# Patient Record
Sex: Male | Born: 1955 | Race: White | Hispanic: No | Marital: Married | Smoking: Former smoker
Health system: Southern US, Community
[De-identification: ages and names within clinical notes are randomized; demographics above are authoritative.]

## PROBLEM LIST (undated history)

## (undated) DIAGNOSIS — I10 Essential (primary) hypertension: Secondary | ICD-10-CM

## (undated) DIAGNOSIS — M1990 Unspecified osteoarthritis, unspecified site: Secondary | ICD-10-CM

## (undated) DIAGNOSIS — E119 Type 2 diabetes mellitus without complications: Secondary | ICD-10-CM

## (undated) DIAGNOSIS — M65312 Trigger thumb, left thumb: Secondary | ICD-10-CM

## (undated) DIAGNOSIS — K219 Gastro-esophageal reflux disease without esophagitis: Secondary | ICD-10-CM

## (undated) DIAGNOSIS — E785 Hyperlipidemia, unspecified: Secondary | ICD-10-CM

## (undated) DIAGNOSIS — N400 Enlarged prostate without lower urinary tract symptoms: Secondary | ICD-10-CM

## (undated) HISTORY — DX: Benign prostatic hyperplasia without lower urinary tract symptoms: N40.0

## (undated) HISTORY — PX: CARPAL TUNNEL RELEASE: SHX101

## (undated) HISTORY — DX: Gastro-esophageal reflux disease without esophagitis: K21.9

## (undated) HISTORY — DX: Essential (primary) hypertension: I10

## (undated) HISTORY — DX: Hyperlipidemia, unspecified: E78.5

---

## 1998-12-04 ENCOUNTER — Other Ambulatory Visit: Admission: RE | Admit: 1998-12-04 | Discharge: 1998-12-04 | Payer: Self-pay | Admitting: Gastroenterology

## 1999-01-22 ENCOUNTER — Encounter: Payer: Self-pay | Admitting: Gastroenterology

## 1999-01-22 ENCOUNTER — Ambulatory Visit (HOSPITAL_COMMUNITY): Admission: RE | Admit: 1999-01-22 | Discharge: 1999-01-22 | Payer: Self-pay | Admitting: Gastroenterology

## 1999-01-29 ENCOUNTER — Ambulatory Visit (HOSPITAL_COMMUNITY): Admission: RE | Admit: 1999-01-29 | Discharge: 1999-01-29 | Payer: Self-pay | Admitting: Gastroenterology

## 1999-01-29 ENCOUNTER — Encounter: Payer: Self-pay | Admitting: Gastroenterology

## 1999-03-12 ENCOUNTER — Encounter: Payer: Self-pay | Admitting: Gastroenterology

## 1999-03-12 ENCOUNTER — Ambulatory Visit (HOSPITAL_COMMUNITY): Admission: RE | Admit: 1999-03-12 | Discharge: 1999-03-12 | Payer: Self-pay | Admitting: Gastroenterology

## 2003-12-10 ENCOUNTER — Encounter: Admission: RE | Admit: 2003-12-10 | Discharge: 2004-01-28 | Payer: Self-pay | Admitting: Orthopedic Surgery

## 2008-02-23 ENCOUNTER — Inpatient Hospital Stay (HOSPITAL_COMMUNITY): Admission: EM | Admit: 2008-02-23 | Discharge: 2008-03-02 | Payer: Self-pay | Admitting: Emergency Medicine

## 2008-02-24 ENCOUNTER — Encounter (INDEPENDENT_AMBULATORY_CARE_PROVIDER_SITE_OTHER): Payer: Self-pay | Admitting: Internal Medicine

## 2008-03-07 ENCOUNTER — Ambulatory Visit: Payer: Self-pay | Admitting: Gastroenterology

## 2010-09-23 ENCOUNTER — Encounter
Admission: RE | Admit: 2010-09-23 | Discharge: 2010-11-06 | Payer: Self-pay | Source: Home / Self Care | Attending: Podiatry | Admitting: Podiatry

## 2010-11-11 ENCOUNTER — Encounter
Admission: RE | Admit: 2010-11-11 | Discharge: 2010-12-09 | Payer: Self-pay | Source: Home / Self Care | Attending: Podiatry | Admitting: Podiatry

## 2011-03-24 NOTE — Consult Note (Signed)
[name], [name]                ACCOUNT NO.:  [id number]   MEDICAL RECORD NO.:  [id number]          PATIENT TYPE:  INP   LOCATION:  5157                         FACILITY:  [facility]   PHYSICIAN:  Maree Krabbe, M.D.DATE OF BIRTH:  02-14-1956   DATE OF CONSULTATION:  DATE OF DISCHARGE:                                 CONSULTATION   REASON FOR CONSULTATION:  Increased creatinine.   HISTORY:  This is a pleasant 55 year old white male with a history of  hypertension, otherwise healthy.  He was admitted on February 23, 2008,  with a 4-day history of abdominal pain, vomiting, and diarrhea.  He had  been already seen and treated with oral Cipro/Flagyl for possible  diverticulitis.  He was unable to eat or drink.  He had cramping in the  lower abdominal pain on both sides.  He was on ACE inhibitors for blood  pressure and has a creatinine of 4.6 on admission with a BUN of 43.  The  patient was admitted and put on IV fluids, his creatinine was up to 6.6  on February 24, 2008, and today, it is 6.6 also.  His urine output has  improved.   The patient denies any use of over-the-counter NSAIDs.  He has not had  any exposure to IV contrast or Lasix.  Currently, he complains of being  thirsty and a Foley catheter was placed for about 12 hours, but he could  not tolerate it and it was removed last night.  He made 1200 mL of urine  yesterday.   PAST MEDICAL HISTORY:  Hypertension.   MEDICATIONS ON ADMISSION:  Cipro, lisinopril, hydrochlorothiazide 20/25  once a day, oral Flagyl, Allegra, Bentyl, Lipitor, Nexium, Zantac, and  Zestril which is duplication of an ACE inhibitor 20 mg once a day.   PAST SURGICAL HISTORY:  Carpal tunnel.   SOCIAL HISTORY:  Works in [employer], operational.  No smoking, alcohol, or drug  use.  He is married and lives with his wife.  They have 1 grown child.   FAMILY HISTORY:  Colon cancer in the mother and father with COPD.   REVIEW OF SYSTEMS:  Noncontributory.   PHYSICAL  EXAMINATION:  VITAL SIGNS:  Blood pressure 143/84, temperature  98.4, pulse is 100, at rest, respirations is 20, O2 sat at 93% on room  air.  GENERAL:  This is a healthy appearing man, in no distress.  SKIN:  Without rash.  HEENT:  PERRL, EOMI.  Throat is clear and slightly dry.  NECK:  Supple with flat neck veins.  CHEST:  Clear throughout the bases.  Clear to percussion also anterior  and posterior.  CARDIAC:  Regular rate and rhythm without murmur, rub, or gallop.  ABDOMEN:  Soft and nontender without masses.  Good bowel sounds.  EXTREMITIES:  No peripheral edema in the feet.  GU:  Normal male genitalia.  NEUROLOGIC:  Nonfocal motor exam, oriented x3.   LABORATORY DATA:  Urine culture, 2000 colonies and insignificant growth.  Blood cultures negative x2.  BUN 56, creatinine 6.6 today, bicarb 19,  potassium of 3.6.  Urine sodium  is 49, phosphorus is 5.2.  UA on 2  occasion on February 23, 2008 and February 24, 2008, is entirely negative with  less than 2 white blood cells and red blood cells and no protein.  The  white blood count is 10.2, platelets are normal at 256, hemoglobin 12.6.  Renal ultrasound done yesterday shows a right kidney of 11.6 cm, left  kidney of 12.4 cm, and no hydronephrosis.   IMPRESSION:  1. Acute renal failure due to acute tubular necrosis from dehydration      and ACE inhibitor effect.  2. Nausea, vomiting, and diarrhea with probable colitis or      diverticulitis, improved.  3. Volume depletion; still looks a bit dry.  4. Hypertension.  5. Rule out benign prostatic hypertrophy (positive symptoms of      incomplete voiding for a year or so).   RECOMMENDATIONS:  1. Increased IV fluids.  Check PSA, agree with current management      including holding ACE inhibitor until creatinine is normal.  2. Check bladder scan in the morning.   DISCUSSION:  This is a patient with acute renal failure in the setting  of acute dehydrating GI illness and ACE inhibitor  exposure.  The renal  failure is likely due to ATN.  SPEP and UPEP have been ordered.  We will  increase his fluids, and he is making good urine now, and I think his  creatinine has probably plateaued today and may start to improve  tomorrow.  Urinalysis is benign.      Maree Krabbe, M.D.  Electronically Signed     RDS/MEDQ  D:  02/26/2008  T:  02/26/2008  Job:  [id number]

## 2011-03-24 NOTE — H&P (Signed)
[name], ARNALL                ACCOUNT NO.:  [id number]   MEDICAL RECORD NO.:  [id number]          PATIENT TYPE:  INP   LOCATION:  1843                         FACILITY:  [facility]   PHYSICIAN:  Hind I Elsaid, MD      DATE OF BIRTH:  October 30, 1956   DATE OF ADMISSION:  02/23/2008  DATE OF DISCHARGE:                              HISTORY & PHYSICAL   CHIEF COMPLAINT:  Abdominal pain and diarrhea since Sunday.   HISTORY OF PRESENT ILLNESS:  This is a 55 year old Caucasian male with  the history of hypertension, hyperlipidemia who started to complain of  lower abdominal pain, started Sunday.  The pain is crampy in nature,  9/10, nonradiating.  Condition associated with diarrhea, which the  patient admitted, he go to bathroom every 15-20 minutes.  Condition also  associated with mild blood per rectum, he noticed on Sunday and Monday.  He went to his primary care physician, were advised to go to [facility]  ED.  The patient went there where he had a CT scan of the abdomen and  pelvis, which showed diverticulitis.  He was treated with Cipro and  Flagyl p.o.  The patient had a visit to [facility] on February 21, 2008.  After that the patient did not feel any significant improvement, he  continued to have nausea and dry heaves, but denies any vomiting.  The  patient admitted abdominal pain getting worse and the diarrhea continued  to persist.  The patient admitted blood on the diarrhea.  The blood on  the stool completely resolved at this time.  Condition associated with  generalized weakness.  Loss of appetite.  According to the patient, he  had his own gastroenterologist, Dr. Robb Matar, where he had a  colonoscopy 1-1/2 year ago with no significant findings.   ALLERGIES:  COZAAR.   MEDICATIONS:  1. Cipro 500 mg.  2. Dicyclomine 10 mg three times daily.  3. Lisinopril/hydrochlorothiazide 20/25 one daily.  4. Metronidazole 500 mg four times daily.  5. Allegra 1 tab daily.  6. Bentyl 1 tab  daily.  7. Lipitor 20 mg daily.  8. Nexium 40 mg daily.  9. Zestril 20 mg daily.   PAST SURGICAL HISTORY:  History of left carpal tunnel syndrome release,  no other surgeries.   SOCIAL HISTORY:  He works in [employer].  He denies any smoking, he  denies any alcohol or drug abuse.  He is married and lives with his  wife.  He has 1 grown child.   FAMILY HISTORY:  Positive for history of colon cancer in his mother and  father died with history of COPD.  He also had family history of lung  cancer in his aunt who died at the age 40.   PHYSICAL EXAMINATION:  VITAL SIGNS:  Temperature 98, blood pressure  127/86, pulse rate 89, and respiratory rate 18.  HEENT:  The patient not in any respiratory distress or in pain.  Pupils  equal and reactive to light and accommodation.  Mucosa dry.  No  tonsillar hypertrophy.  No lymphadenopathy.  No thyromegaly.  LUNGS:  Normal vesicular breathing with equal air entry.  ABDOMEN:  Soft.  Some tenderness at the left lower quadrant.  No  rebound.  No guarding.  Bowel sounds positive.  No organomegaly.  EXTREMITIES:  Peripheral pulses intact and no lower limb edema.  CNS:  The patient alert and oriented x3.  There is no focal or  neurological finding.   LABORATORY DATA:  Blood workup: Sodium 134, potassium 4.1, chloride 98,  carbon dioxide 20, glucose 129, BUN 43, creatinine 4.67, calcium 9.5,  total protein 7.2, albumin 4.2, AST 24, ALT 23, alkaline phosphatase 59,  and bilirubin 0.9.  White blood cells 12.9, hemoglobin 13.9, hematocrit  40.7, and platelets 248.  EKG, normal sinus rhythm.   ASSESSMENT AND PLAN:  1. This is a 55 year old male who failed outpatient treatment for      acute diverticulitis admitted with persistent diarrhea and      abdominal pain.  Diagnosis most probably secondary to      diverticulitis.  We need to rule out E. coli infection/iscemic      colitis.  Anyhow, the patient will be admitted to the hospital,      keep n.p.o.,  start on IV fluids, start Cipro and Flagyl IV, we will      get the CT scan of the abdomen and pelvis report or the CD from      [facility], otherwise we will go ahead and get CT of the      abdomen and pelvis during hospitalization.  Further recommendations      depend on patient's hospital course.  2. Regarding acute renal failure, the patient will be started on IV      fluids.  We will hold all his blood pressure medication.  3. History of hypertension.  Blood pressure reasonably controlled at      this time.  We will hold all blood pressure medications.  We will      place the patient on Clonidine p.o. as p.r.n., dose to be started      if systolic blood pressure above 914.  DVT and GI for prophylaxis.      Hind Bosie Helper, MD  Electronically Signed     HIE/MEDQ  D:  02/23/2008  T:  02/23/2008  Job:  [id number]

## 2011-03-24 NOTE — Discharge Summary (Signed)
[name], [name] NO.:  [id number]   MEDICAL RECORD NO.:  [id number]          PATIENT TYPE:  INP   LOCATION:  5157                         FACILITY:  [facility]   PHYSICIAN:  Altha Harm, MDDATE OF BIRTH:  10-26-1956   DATE OF ADMISSION:  02/23/2008  DATE OF DISCHARGE:                               DISCHARGE SUMMARY   ANTICIPATED DATE OF DISCHARGE:  March 02, 2008.   DISCHARGE DISPOSITION:  Home   FINAL DISCHARGE DIAGNOSES:  1. Intractable vomiting and diarrhea.  2. Dehydration, resolved.  3. Colitis, resolved.  4. Acute renal failure.  5. Acute tubular necrosis.  6. Hypertension.  7. Hyperlipidemia.   MEDICATIONS AT DISCHARGE:  1. Lipitor 20 mg p.o. daily.  2. Aspirin 81 mg p.o. daily.  3. Multivitamin 1 tablet p.o. daily.  4. Lopressor 50 mg p.o. b.i.d.  5. Norvasc 10 mg p.o. daily  6. Multivitamin 1 tablet p.o. daily.   CONSULTANTS:  1. Nephrology.  2. Gastroenterology.   PROCEDURES:  None.   DIAGNOSTIC STUDIES:  1. Renal ultrasound done on admission which shows no hydronephrosis or      diagnostic renal calculus.  Trace perinephric fluid noted lower      pole of the left kidney.  Probable fatty infiltration of the liver.  2. CT of the abdomen without contrast which shows mild circumferential      wall thickening of the distal descending colon with some very mild      pericolic fat changes.  These changes are more suggestive of acute      colitis.  Potential etiologies include infectious, inflammatory, or      ischemic.  3. CT of the pelvis without contrast which shows findings as per CT of      the abdomen.  4. Abdominal one view done on April 21 which shows a benign-appearing      abdomen.  5. Ultrasound of the abdomen done on April 21 which shows no evidence      of ascites, bilateral pleural effusions, fatty infiltration of      liver.   CODE STATUS:  Full code.   ALLERGIES:  LOSARTAN.   Medications to be avoided:  ACE  inhibitors secondary to acute renal  failure.   PRIMARY CARE PHYSICIAN:  Dr. Rudi Heap, [facility]/Donna Bevelyn Buckles, nurse practitioner.   Abdominal pain and diarrhea x4 days.   HISTORY OF PRESENT ILLNESS:  Please see H&P dictated by Dr. Eda Paschal for  details of the HPI.   HOSPITAL COURSE:  1. Colitis.  The patient was admitted and placed on bowel rest.  He      was given aggressive fluid hydration and started on ciprofloxacin      and Flagyl.  Gastroenterology was consulted and saw the patient,      and the patient was subsequently taken off of the Cipro and Flagyl      as it was felt that this was not an infectious etiology disease.      The diarrhea and vomiting resolved itself, and the patient was able  to tolerate diet without any difficulty.  2. Acute tubular necrosis with acute renal failure.  This was felt      that it was secondary to the patient's hypovolemic state coupled      with the use of an ACE inhibitor.  The patient had significant      increase in his creatinine which peaked at 6.6 on the day of      admission and so forth subsequently resolved down to a creatinine      on April 23 of 3.89.  The patient was seen by the nephrologist who      managed the patient conservatively without any need for dialysis.      The patient is now off of IV fluids and off of diuretics at this      point.  The patient will be seen by the nephrologist in the      morning, and an addendum to this discharge summary may be necessary      depending on the plan set up by the nephrologist.  3. Hypertension.  The patient had been on lisinopril for his      hypertension.  The patient has had elevated blood pressures here in      the hospital.  It is felt that this is contributory by his renal      failure.  I expect that the blood pressures will improve as his      renal function improves.  The patient is now on Norvasc and      Lopressor and the doses stated  above.  The patient should have      close follow-up for his blood pressure and further titration of      medications.  At this point, I would stay away from any diuretics      unless directed by the nephrologist.  If the patient still requires      an additional medication, I would suggest clonidine in this      patient.  However, the patient still has much room for increase in      his Lopressor and his Norvasc as it necessitates.  I will defer      this to his treating physician in the outpatient setting to further      titrate his medications.  In terms of his other chronic medical      problems, they remain stable, and the patient is continued on his      medications above.   FOLLOW UP:  The patient is to follow up with the nephrologist in the  time frame that will be specified at discharge.  Follow up with his  primary care physician in 1 week for monitoring of blood pressure.   DIETARY RESTRICTIONS:  The patient should be on 2-g sodium low  cholesterol diet.   PHYSICAL RESTRICTIONS:  None.      Altha Harm, MD  Electronically Signed     MAM/MEDQ  D:  03/01/2008  T:  03/01/2008  Job:  [id number]   cc:   Deter[place], M.D.  Ernestina Penna, M.D.

## 2011-08-04 LAB — CBC
HCT: 31.9 — ABNORMAL LOW
HCT: 34.2 — ABNORMAL LOW
HCT: 36.1 — ABNORMAL LOW
Hemoglobin: 10.9 — ABNORMAL LOW
Hemoglobin: 10.9 — ABNORMAL LOW
Hemoglobin: 11.1 — ABNORMAL LOW
Hemoglobin: 11.9 — ABNORMAL LOW
Hemoglobin: 11.9 — ABNORMAL LOW
Hemoglobin: 12.6 — ABNORMAL LOW
MCHC: 34.1
MCHC: 34.8
MCHC: 34.9
MCHC: 35.3
MCV: 83.9
MCV: 84.1
MCV: 84.7
Platelets: 248
Platelets: 323
Platelets: 347
RBC: 3.77 — ABNORMAL LOW
RBC: 3.95 — ABNORMAL LOW
RBC: 4.07 — ABNORMAL LOW
RDW: 13.6
RDW: 13.6
RDW: 13.7
RDW: 13.9
WBC: 10.7 — ABNORMAL HIGH
WBC: 8
WBC: 9.2
WBC: 9.4

## 2011-08-04 LAB — BASIC METABOLIC PANEL
BUN: 37 — ABNORMAL HIGH
BUN: 54 — ABNORMAL HIGH
CO2: 19
CO2: 19
CO2: 20
Calcium: 8.3 — ABNORMAL LOW
Calcium: 8.7
Calcium: 8.8
Chloride: 106
Chloride: 106
Creatinine, Ser: 4.25 — ABNORMAL HIGH
Creatinine, Ser: 5.63 — ABNORMAL HIGH
GFR calc Af Amer: 11 — ABNORMAL LOW
GFR calc Af Amer: 13 — ABNORMAL LOW
GFR calc non Af Amer: 11 — ABNORMAL LOW
GFR calc non Af Amer: 15 — ABNORMAL LOW
GFR calc non Af Amer: 9 — ABNORMAL LOW
Glucose, Bld: 100 — ABNORMAL HIGH
Glucose, Bld: 103 — ABNORMAL HIGH
Glucose, Bld: 89
Potassium: 3.6
Potassium: 3.8
Sodium: 138
Sodium: 139
Sodium: 144

## 2011-08-04 LAB — COMPREHENSIVE METABOLIC PANEL
ALT: 23
Albumin: 3.3 — ABNORMAL LOW
Albumin: 4.2
Alkaline Phosphatase: 36 — ABNORMAL LOW
Alkaline Phosphatase: 39
BUN: 51 — ABNORMAL HIGH
CO2: 21
Chloride: 107
Creatinine, Ser: 6.03 — ABNORMAL HIGH
GFR calc non Af Amer: 10 — ABNORMAL LOW
Potassium: 3.8
Potassium: 4.1
Sodium: 134 — ABNORMAL LOW
Total Bilirubin: 0.5
Total Protein: 7.2

## 2011-08-04 LAB — URINE MICROSCOPIC-ADD ON

## 2011-08-04 LAB — RENAL FUNCTION PANEL
Albumin: 2.7 — ABNORMAL LOW
BUN: 37 — ABNORMAL HIGH
BUN: 42 — ABNORMAL HIGH
CO2: 19
CO2: 25
CO2: 26
Calcium: 8.3 — ABNORMAL LOW
Calcium: 8.9
Chloride: 108
Chloride: 110
Chloride: 113 — ABNORMAL HIGH
Chloride: 113 — ABNORMAL HIGH
Creatinine, Ser: 3.26 — ABNORMAL HIGH
Creatinine, Ser: 3.89 — ABNORMAL HIGH
GFR calc Af Amer: 13 — ABNORMAL LOW
GFR calc Af Amer: 24 — ABNORMAL LOW
GFR calc non Af Amer: 11 — ABNORMAL LOW
GFR calc non Af Amer: 20 — ABNORMAL LOW
Glucose, Bld: 112 — ABNORMAL HIGH
Glucose, Bld: 127 — ABNORMAL HIGH
Potassium: 3.2 — ABNORMAL LOW
Potassium: 3.5
Sodium: 140
Sodium: 143

## 2011-08-04 LAB — FECAL LACTOFERRIN, QUANT

## 2011-08-04 LAB — UIFE/LIGHT CHAINS/TP QN, 24-HR UR
Alpha 2, Urine: DETECTED — AB
Beta, Urine: DETECTED — AB
Free Kappa Lt Chains,Ur: 1.15 (ref 0.04–1.51)
Free Lambda Lt Chains,Ur: 0.24 (ref 0.08–1.01)
Gamma Globulin, Urine: DETECTED — AB

## 2011-08-04 LAB — URINALYSIS, ROUTINE W REFLEX MICROSCOPIC
Glucose, UA: NEGATIVE
Hgb urine dipstick: NEGATIVE
Protein, ur: 100 — AB
pH: 5

## 2011-08-04 LAB — URINALYSIS, MICROSCOPIC ONLY
Bilirubin Urine: NEGATIVE
Nitrite: NEGATIVE
Specific Gravity, Urine: 1.01
Urobilinogen, UA: 0.2
pH: 5

## 2011-08-04 LAB — URINE CULTURE

## 2011-08-04 LAB — PROTIME-INR: INR: 1

## 2011-08-04 LAB — DIFFERENTIAL
Basophils Relative: 0
Eosinophils Absolute: 0.1
Lymphs Abs: 1.2
Monocytes Absolute: 1.2 — ABNORMAL HIGH
Monocytes Relative: 10
Neutro Abs: 10.3 — ABNORMAL HIGH

## 2011-08-04 LAB — URINE DRUGS OF ABUSE SCREEN W ALC, ROUTINE (REF LAB)
Amphetamine Screen, Ur: NEGATIVE
Barbiturate Quant, Ur: NEGATIVE
Creatinine,U: 187.8
Ethyl Alcohol: <5
Marijuana Metabolite: NEGATIVE
Methadone: NEGATIVE

## 2011-08-04 LAB — CULTURE, BLOOD (ROUTINE X 2)
Culture: NO GROWTH
Culture: NO GROWTH

## 2011-08-04 LAB — HEMOGLOBIN A1C: Mean Plasma Glucose: 133

## 2011-08-04 LAB — CLOSTRIDIUM DIFFICILE EIA: C difficile Toxins A+B, EIA: NEGATIVE

## 2011-08-04 LAB — APTT: aPTT: 29

## 2011-08-04 LAB — PROTEIN ELECTROPHORESIS, SERUM
Alpha-1-Globulin: 6.4 — ABNORMAL HIGH
Alpha-2-Globulin: 14.2 — ABNORMAL HIGH
Beta Globulin: 6.8
Total Protein ELP: 6.2

## 2011-08-04 LAB — GIARDIA/CRYPTOSPORIDIUM SCREEN(EIA): Giardia Screen - EIA: NEGATIVE

## 2011-08-04 LAB — MAGNESIUM
Magnesium: 1.6
Magnesium: 1.9

## 2011-08-04 LAB — OSMOLALITY, URINE: Osmolality, Ur: 265 — ABNORMAL LOW

## 2011-08-04 LAB — HEPATITIS PANEL, ACUTE: Hepatitis B Surface Ag: NEGATIVE

## 2011-08-04 LAB — SODIUM, URINE, RANDOM: Sodium, Ur: 49

## 2012-02-04 ENCOUNTER — Encounter (INDEPENDENT_AMBULATORY_CARE_PROVIDER_SITE_OTHER): Payer: Self-pay | Admitting: *Deleted

## 2012-02-22 ENCOUNTER — Encounter (INDEPENDENT_AMBULATORY_CARE_PROVIDER_SITE_OTHER): Payer: Self-pay | Admitting: Internal Medicine

## 2012-02-22 ENCOUNTER — Other Ambulatory Visit (INDEPENDENT_AMBULATORY_CARE_PROVIDER_SITE_OTHER): Payer: Self-pay | Admitting: *Deleted

## 2012-02-22 ENCOUNTER — Ambulatory Visit (INDEPENDENT_AMBULATORY_CARE_PROVIDER_SITE_OTHER): Payer: BC Managed Care – PPO | Admitting: Internal Medicine

## 2012-02-22 ENCOUNTER — Telehealth (INDEPENDENT_AMBULATORY_CARE_PROVIDER_SITE_OTHER): Payer: Self-pay | Admitting: *Deleted

## 2012-02-22 VITALS — BP 98/70 | HR 64 | Temp 98.1°F | Ht 68.0 in | Wt 184.8 lb

## 2012-02-22 DIAGNOSIS — Z800 Family history of malignant neoplasm of digestive organs: Secondary | ICD-10-CM

## 2012-02-22 DIAGNOSIS — E7800 Pure hypercholesterolemia, unspecified: Secondary | ICD-10-CM | POA: Insufficient documentation

## 2012-02-22 DIAGNOSIS — I10 Essential (primary) hypertension: Secondary | ICD-10-CM | POA: Insufficient documentation

## 2012-02-22 MED ORDER — PEG-KCL-NACL-NASULF-NA ASC-C 100 G PO SOLR: 1.0000 | Freq: Once | ORAL | Status: DC

## 2012-02-22 NOTE — Patient Instructions (Signed)
Bloating  Bloating is the feeling of fullness in your belly. You may feel as though your pants are too tight. Often the cause of bloating is overeating, retaining fluids, or having gas in your bowel. It is also caused by swallowing air and eating foods that cause gas. Irritable bowel syndrome is one of the most common causes of bloating. Constipation is also a common cause. Sometimes more serious problems can cause bloating.  SYMPTOMS    Usually there is a feeling of fullness, as though your abdomen is bulged out. There may be mild discomfort.    DIAGNOSIS    Usually no particular testing is necessary for most bloating. If the condition persists and seems to become worse, your caregiver may do additional testing.    TREATMENT     There is no direct treatment for bloating.    Do not put gas into the bowel. Avoid chewing gum and sucking on candy. These tend to make you swallow air. Swallowing air can also be a nervous habit. Try to avoid this.    Avoiding high residue diets will help. Eat foods with soluble fibers (examples include root vegetables, apples, or barley) and substitute dairy products with soy and rice products. This helps irritable bowel syndrome.    If constipation is the cause, then a high residue diet with more fiber will help.    Avoid carbonated beverages.    Over-the-counter preparations are available that help reduce gas. Your pharmacist can help you with this.   SEEK MEDICAL CARE IF:     Bloating continues and seems to be getting worse.    You notice a weight gain.    You have a weight loss but the bloating is getting worse.    You have changes in your bowel habits or develop nausea or vomiting.   SEEK IMMEDIATE MEDICAL CARE IF:     You develop shortness of breath or swelling in your legs.    You have an increase in abdominal pain or develop chest pain.   Document Released: 08/26/2006 Document Revised: 10/15/2011 Document Reviewed: 10/14/2007   [employer] Patient Information 2012 [employer].

## 2012-02-22 NOTE — Progress Notes (Signed)
Subjective:     Patient ID: [name], male   DOB: 08/23/1956, 56 y.o.   MRN: [id number]  HPI  Referred to our office by Joette Catching for bloating. No acid reflux.  He says his stomach get very tight and hurts his back.  He tells me he had colitis  2 yrs ago.  He says he has lost 6 pounds  4-5 weeks ago when he had a virus.  He is now on a diet for weight loss.   His symptoms included diarrhea, vomiting, no appetite, nausea and fever.  He was put on Cipro and Flagyl and symptoms resolved except for the diarrhea. The diarrhea continued for about 2 weeks.    He is having ablout one stool a day. Stool are normal size.  No melena or bright red rectal bleeding.  No abdominal pain except for the bloating.  His last colonoscopy was 5-6 yrs ago by Dr. Cleotis Nipper which he tells me was normal. Family hx of colon cancer in a 1st degree relative (Mother) CT abdomen and pelvis 02/16/2008:PROCEDURES: None.  DIAGNOSTIC STUDIES:  1. Renal ultrasound done on admission which shows no hydronephrosis or  diagnostic renal calculus. Trace perinephric fluid noted lower  pole of the left kidney. Probable fatty infiltration of the liver.  2. CT of the abdomen without contrast which shows mild circumferential  wall thickening of the distal descending colon with some very mild  pericolic fat changes. These changes are more suggestive of acute  colitis. Potential etiologies include infectious, inflammatory, or  ischemic.  3. CT of the pelvis without contrast which shows findings as per CT of  the abdomen.  4. Abdominal one view done on April 21 which shows a benign-appearing  abdomen.  5. Ultrasound of the abdomen done on April 21 which shows no evidence  of ascites, bilateral pleural effusions, fatty infiltration of  Review of Systems see hpi Current Outpatient Prescriptions  Medication Sig Dispense Refill  . amLODipine (NORVASC) 10 MG tablet Take 10 mg by mouth daily.      Marland Kitchen aspirin 81 MG tablet Take 81 mg by  mouth daily.      Marland Kitchen lisinopril (PRINIVIL,ZESTRIL) 10 MG tablet Take 10 mg by mouth daily.      . rosuvastatin (CRESTOR) 10 MG tablet Take 10 mg by mouth daily.       Past Medical History  Diagnosis Date  . Hypertension   . GERD (gastroesophageal reflux disease)   . Hyperlipidemia   . Enlarged prostate    History   Social History  . Marital Status: Married    Spouse Name: N/A    Number of Children: N/A  . Years of Education: N/A   Occupational History  . Not on file.   Social History Main Topics  . Smoking status: Never Smoker   . Smokeless tobacco: Not on file  . Alcohol Use: No  . Drug Use: No  . Sexually Active: Not on file   Other Topics Concern  . Not on file   Social History Narrative  . No narrative on file   Family Status  Relation Status Death Age  . Mother Deceased     Vascular   . Father Deceased     COPD  . Sister Deceased     One deceased from ?, One with atrial flutter, diabetes. One is good health but obese.   Allergies  Allergen Reactions  . Cozaar        Objective:  Physical Exam  Filed Vitals:   02/22/12 1531  Height: 5\' 8"  (1.727 m)  Weight: 184 lb 12.8 oz (83.825 kg)    Alert and oriented. Skin warm and dry. Oral mucosa is moist.   . Sclera anicteric, conjunctivae is pink. Thyroid not enlarged. No cervical lymphadenopathy. Lungs clear. Heart regular rate and rhythm.  Abdomen is soft. Bowel sounds are positive. No hepatomegaly. No abdominal masses felt. No tenderness.  No edema to lower extremities. Patient is alert and oriented.      Assessment:    Bloating. Family hx of colon cancer.    Plan:    Surveillance colonoscopy for family hx of colon cancer. Samples of Aciphex given to patient to try.

## 2012-02-22 NOTE — Telephone Encounter (Signed)
Patient needs movi prep 

## 2012-03-23 MED ORDER — SODIUM CHLORIDE 0.45 % IV SOLN
Freq: Once | INTRAVENOUS | Status: AC
  Administered 2012-03-24: 1000 mL via INTRAVENOUS

## 2012-03-24 ENCOUNTER — Encounter (HOSPITAL_COMMUNITY): Payer: Self-pay | Admitting: *Deleted

## 2012-03-24 ENCOUNTER — Encounter (HOSPITAL_COMMUNITY)
Admission: RE | Disposition: A | Discharge status: Home / Self Care | Payer: Self-pay | Source: Ambulatory Visit | Attending: Internal Medicine

## 2012-03-24 ENCOUNTER — Ambulatory Visit (HOSPITAL_COMMUNITY)
Admission: RE | Admit: 2012-03-24 | Discharge: 2012-03-24 | Disposition: A | Discharge status: Home / Self Care | Payer: BC Managed Care – PPO | Source: Ambulatory Visit | Attending: Internal Medicine | Admitting: Internal Medicine

## 2012-03-24 DIAGNOSIS — K5730 Diverticulosis of large intestine without perforation or abscess without bleeding: Secondary | ICD-10-CM | POA: Insufficient documentation

## 2012-03-24 DIAGNOSIS — Z86010 Personal history of colonic polyps: Secondary | ICD-10-CM

## 2012-03-24 DIAGNOSIS — Z800 Family history of malignant neoplasm of digestive organs: Secondary | ICD-10-CM

## 2012-03-24 DIAGNOSIS — Z09 Encounter for follow-up examination after completed treatment for conditions other than malignant neoplasm: Secondary | ICD-10-CM | POA: Insufficient documentation

## 2012-03-24 DIAGNOSIS — I10 Essential (primary) hypertension: Secondary | ICD-10-CM | POA: Insufficient documentation

## 2012-03-24 DIAGNOSIS — Z7982 Long term (current) use of aspirin: Secondary | ICD-10-CM | POA: Insufficient documentation

## 2012-03-24 DIAGNOSIS — D126 Benign neoplasm of colon, unspecified: Secondary | ICD-10-CM

## 2012-03-24 DIAGNOSIS — K210 Gastro-esophageal reflux disease with esophagitis: Secondary | ICD-10-CM

## 2012-03-24 DIAGNOSIS — K644 Residual hemorrhoidal skin tags: Secondary | ICD-10-CM

## 2012-03-24 DIAGNOSIS — Z79899 Other long term (current) drug therapy: Secondary | ICD-10-CM | POA: Insufficient documentation

## 2012-03-24 HISTORY — PX: COLONOSCOPY: SHX5424

## 2012-03-24 SURGERY — COLONOSCOPY
Anesthesia: Moderate Sedation

## 2012-03-24 MED ORDER — STERILE WATER FOR IRRIGATION IR SOLN
Status: DC | PRN
  Administered 2012-03-24: 09:00:00

## 2012-03-24 MED ORDER — MEPERIDINE HCL 50 MG/ML IJ SOLN
INTRAMUSCULAR | Status: AC
  Filled 2012-03-24: qty 1

## 2012-03-24 MED ORDER — MIDAZOLAM HCL 5 MG/5ML IJ SOLN
INTRAMUSCULAR | Status: AC
  Filled 2012-03-24: qty 10

## 2012-03-24 MED ORDER — MEPERIDINE HCL 50 MG/ML IJ SOLN
INTRAMUSCULAR | Status: DC | PRN
  Administered 2012-03-24 (×2): 25 mg via INTRAVENOUS

## 2012-03-24 MED ORDER — MIDAZOLAM HCL 5 MG/5ML IJ SOLN
INTRAMUSCULAR | Status: DC | PRN
  Administered 2012-03-24 (×3): 2 mg via INTRAVENOUS

## 2012-03-24 NOTE — Op Note (Signed)
COLONOSCOPY PROCEDURE REPORT  PATIENT:  [name]  MR#:  [id number] Birthdate:  Jun 11, 1956, 56 y.o., male Endoscopist:  Dr. Malissa Hippo, MD Referred By:  Dr. Josue Hector, MD Procedure Date: 03/24/2012  Procedure:   Colonoscopy  Indications:  Patient is 56 year old Caucasian male with history of colonic polyps and family history of colon carcinoma (mother and maternal aunt). Patient is undergoing surveillance colonoscopy.  Informed Consent:  The procedure and risks were reviewed with the patient and informed consent was obtained.  Medications:  Demerol 50 mg IV Versed 6 mg IV  Description of procedure:  After a digital rectal exam was performed, that colonoscope was advanced from the anus through the rectum and colon to the area of the cecum, ileocecal valve and appendiceal orifice. The cecum was deeply intubated. These structures were well-seen and photographed for the record. From the level of the cecum and ileocecal valve, the scope was slowly and cautiously withdrawn. The mucosal surfaces were carefully surveyed utilizing scope tip to flexion to facilitate fold flattening as needed. The scope was pulled down into the rectum where a thorough exam including retroflexion was performed.  Findings:   Prep excellent. Three small diverticula noted to at sigmoid and one in transverse colon. Three tiny polyps ablated via cold biopsy from distal sigmoid colon and submitted together. Normal rectal mucosa. Dominant hemorrhoids below the dentate line.  Therapeutic/Diagnostic Maneuvers Performed:  See above  Complications:  None  Cecal Withdrawal Time:  10 minutes  Impression:  Examination performed to cecum. Three small diverticula as above. Three tiny polyps ablated via cold biopsy from sigmoid colon and submitted together. External hemorrhoids.  Recommendations:  Standard instructions given. Will try Align for bloating. I will contact patient with biopsy  results. Next colonoscopy in 5 years.  Tania Steinhauser U  03/24/2012 9:11 AM  CC: Dr. Josue Hector, MD, MD & Dr. Bonnetta Barry ref. provider found

## 2012-03-24 NOTE — H&P (Signed)
[name] is an 56 y.o. male.   Chief Complaint: Patient is here for colonoscopy. HPI: Patient is 57 year old Caucasian male who is in for surveillance colonoscopy. He has history of colonic polyps. First exam was 10 years ago with removal of 2 small polyps. Last exam 5 years ago in [facility] was normal. He denies abdominal pain melena rectal bleeding or change in bowel habits. He is experiencing intermittent bloating. Bloating has decreased since he has lost 35 pounds voluntarily. He rarely experiences heartburn. This again has improved with his weight loss. Family history significant for colon carcinoma in his mother at age 86 and she died at 72 of unrelated causes. Maternal aunts died of metastatic colon carcinoma at age 107 of the maternal aunt died of lung carcinoma.  Past Medical History  Diagnosis Date  . Hypertension   . GERD (gastroesophageal reflux disease)   . Hyperlipidemia   . Enlarged prostate     Past Surgical History  Procedure Date  . Carpal tunnel release     History reviewed. No pertinent family history. Social History:  reports that he has never smoked. He does not have any smokeless tobacco history on file. He reports that he does not drink alcohol or use illicit drugs.  Allergies:  Allergies  Allergen Reactions  . Cozaar     Medications Prior to Admission  Medication Sig Dispense Refill  . amLODipine (NORVASC) 10 MG tablet Take 10 mg by mouth daily.      Marland Kitchen aspirin 81 MG tablet Take 81 mg by mouth daily.      Marland Kitchen lisinopril (PRINIVIL,ZESTRIL) 10 MG tablet Take 10 mg by mouth daily.      . peg 3350 powder (MOVIPREP) SOLR Take 1 kit (100 g total) by mouth once.  1 kit  0  . rosuvastatin (CRESTOR) 10 MG tablet Take 10 mg by mouth daily.        No results found for this or any previous visit (from the past 48 hour(s)). No results found.  Review of Systems  Gastrointestinal: Negative for abdominal pain, diarrhea, constipation, blood in stool and  melena.    Blood pressure 144/88, pulse 76, temperature 98.2 F (36.8 C), temperature source Oral, resp. rate 16, height 5\' 8"  (1.727 m), weight 170 lb (77.111 kg), SpO2 97.00%. Physical Exam  Constitutional: He appears well-developed and well-nourished.  Eyes: Conjunctivae are normal. No scleral icterus.  Neck: No thyromegaly present.  Cardiovascular: Normal rate, regular rhythm and normal heart sounds.   No murmur heard. Respiratory: Effort normal and breath sounds normal.  GI: Soft. He exhibits no distension and no mass. There is no tenderness.  Musculoskeletal: He exhibits no edema.  Lymphadenopathy:    He has no cervical adenopathy.  Neurological: He is alert.  Skin: Skin is warm and dry.     Assessment/Plan History of colonic polyps. Family history of colon carcinoma. Surveillance colonoscopy.  Deavin Forst U 03/24/2012, 8:35 AM

## 2012-03-24 NOTE — Discharge Instructions (Addendum)
Resume usual medications and diet. Align 1 capsule by mouth daily(OTC) No driving for 24 hours. Physician will contact you with biopsy results.Colon Polyps A polyp is extra tissue that grows inside your body. Colon polyps grow in the large intestine. The large intestine, also called the colon, is part of your digestive system. It is a long, hollow tube at the end of your digestive tract where your body makes and stores stool. Most polyps are not dangerous. They are benign. This means they are not cancerous. But over time, some types of polyps can turn into cancer. Polyps that are smaller than a pea are usually not harmful. But larger polyps could someday become or may already be cancerous. To be safe, doctors remove all polyps and test them.  WHO GETS POLYPS? Anyone can get polyps, but certain people are more likely than others. You may have a greater chance of getting polyps if:  You are over 50.   You have had polyps before.   Someone in your family has had polyps.   Someone in your family has had cancer of the large intestine.   Find out if someone in your family has had polyps. You may also be more likely to get polyps if you:   Eat a lot of fatty foods.   Smoke.   Drink alcohol.   Do not exercise.   Eat too much.  SYMPTOMS  Most small polyps do not cause symptoms. People often do not know they have one until their caregiver finds it during a regular checkup or while testing them for something else. Some people do have symptoms like these:  Bleeding from the anus. You might notice blood on your underwear or on toilet paper after you have had a bowel movement.   Constipation or diarrhea that lasts more than a week.   Blood in the stool. Blood can make stool look black or it can show up as red streaks in the stool.  If you have any of these symptoms, see your caregiver. HOW DOES THE DOCTOR TEST FOR POLYPS? The doctor can use four tests to check for polyps:  Digital rectal  exam. The caregiver wears gloves and checks your rectum (the last part of the large intestine) to see if it feels normal. This test would find polyps only in the rectum. Your caregiver may need to do one of the other tests listed below to find polyps higher up in the intestine.   Barium enema. The caregiver puts a liquid called barium into your rectum before taking x-rays of your large intestine. Barium makes your intestine look white in the pictures. Polyps are dark, so they are easy to see.   Sigmoidoscopy. With this test, the caregiver can see inside your large intestine. A thin flexible tube is placed into your rectum. The device is called a sigmoidoscope, which has a light and a tiny video camera in it. The caregiver uses the sigmoidoscope to look at the last third of your large intestine.   Colonoscopy. This test is like sigmoidoscopy, but the caregiver looks at all of the large intestine. It usually requires sedation. This is the most common method for finding and removing polyps.  TREATMENT   The caregiver will remove the polyp during sigmoidoscopy or colonoscopy. The polyp is then tested for cancer.   If you have had polyps, your caregiver may want you to get tested regularly in the future.  PREVENTION  There is not one sure way to prevent polyps.  You might be able to lower your risk of getting them if you:  Eat more fruits and vegetables and less fatty food.   Do not smoke.   Avoid alcohol.   Exercise every day.   Lose weight if you are overweight.   Eating more calcium and folate can also lower your risk of getting polyps. Some foods that are rich in calcium are milk, cheese, and broccoli. Some foods that are rich in folate are chickpeas, kidney beans, and spinach.   Aspirin might help prevent polyps. Studies are under way.  Document Released: 07/22/2004 Document Revised: 10/15/2011 Document Reviewed: 12/28/2007 [facility] Patient Information 2012 [place], [place].

## 2012-03-28 ENCOUNTER — Encounter (HOSPITAL_COMMUNITY): Payer: Self-pay | Admitting: Internal Medicine

## 2012-03-29 ENCOUNTER — Encounter (INDEPENDENT_AMBULATORY_CARE_PROVIDER_SITE_OTHER): Payer: Self-pay | Admitting: *Deleted

## 2012-06-14 ENCOUNTER — Telehealth (INDEPENDENT_AMBULATORY_CARE_PROVIDER_SITE_OTHER): Payer: Self-pay | Admitting: *Deleted

## 2012-06-14 NOTE — Telephone Encounter (Signed)
[name], please arrange consultation with his surgeon. You may have to call and find his surgeon's name.

## 2012-06-14 NOTE — Telephone Encounter (Signed)
Patient's wife said [name] would like to get his hemorrhoids removed. They are bothering him really bad and the suppositories are not helping. Patient's return phone number is [phone].

## 2012-06-15 NOTE — Telephone Encounter (Signed)
I called and spoke with [name]. He does not know nor have a Careers adviser. He would like for his surgery to be done in [place]. Patient advised that [name] referral coordinator would call him back with information.  Ann please make an appointment for a surgical consult for patient.  Hemorrhoids are extremely bad per patient , to the point of him not being able to move.

## 2012-06-16 NOTE — Telephone Encounter (Signed)
appt sch'd 06/21/12 at 11:00 (10:45) w/ Dr Caesar Bookman, patient aware, records faxed

## 2013-08-08 ENCOUNTER — Encounter: Payer: BC Managed Care – PPO | Admitting: Family Medicine

## 2013-11-05 ENCOUNTER — Emergency Department (HOSPITAL_COMMUNITY)
Admission: EM | Admit: 2013-11-05 | Discharge: 2013-11-05 | Disposition: A | Discharge status: Home / Self Care | Payer: BC Managed Care – PPO | Attending: Emergency Medicine | Admitting: Emergency Medicine

## 2013-11-05 ENCOUNTER — Emergency Department (HOSPITAL_COMMUNITY): Payer: BC Managed Care – PPO

## 2013-11-05 ENCOUNTER — Encounter (HOSPITAL_COMMUNITY): Payer: Self-pay | Admitting: Emergency Medicine

## 2013-11-05 DIAGNOSIS — J321 Chronic frontal sinusitis: Secondary | ICD-10-CM | POA: Insufficient documentation

## 2013-11-05 DIAGNOSIS — J029 Acute pharyngitis, unspecified: Secondary | ICD-10-CM | POA: Insufficient documentation

## 2013-11-05 DIAGNOSIS — Z7982 Long term (current) use of aspirin: Secondary | ICD-10-CM | POA: Insufficient documentation

## 2013-11-05 DIAGNOSIS — R0982 Postnasal drip: Secondary | ICD-10-CM | POA: Insufficient documentation

## 2013-11-05 DIAGNOSIS — R509 Fever, unspecified: Secondary | ICD-10-CM | POA: Insufficient documentation

## 2013-11-05 DIAGNOSIS — R059 Cough, unspecified: Secondary | ICD-10-CM | POA: Insufficient documentation

## 2013-11-05 DIAGNOSIS — I10 Essential (primary) hypertension: Secondary | ICD-10-CM | POA: Insufficient documentation

## 2013-11-05 DIAGNOSIS — Z8719 Personal history of other diseases of the digestive system: Secondary | ICD-10-CM | POA: Insufficient documentation

## 2013-11-05 DIAGNOSIS — Y929 Unspecified place or not applicable: Secondary | ICD-10-CM | POA: Insufficient documentation

## 2013-11-05 DIAGNOSIS — S4980XA Other specified injuries of shoulder and upper arm, unspecified arm, initial encounter: Secondary | ICD-10-CM | POA: Insufficient documentation

## 2013-11-05 DIAGNOSIS — R52 Pain, unspecified: Secondary | ICD-10-CM | POA: Insufficient documentation

## 2013-11-05 DIAGNOSIS — Z87448 Personal history of other diseases of urinary system: Secondary | ICD-10-CM | POA: Insufficient documentation

## 2013-11-05 DIAGNOSIS — X58XXXA Exposure to other specified factors, initial encounter: Secondary | ICD-10-CM | POA: Insufficient documentation

## 2013-11-05 DIAGNOSIS — S139XXA Sprain of joints and ligaments of unspecified parts of neck, initial encounter: Secondary | ICD-10-CM | POA: Insufficient documentation

## 2013-11-05 DIAGNOSIS — S46909A Unspecified injury of unspecified muscle, fascia and tendon at shoulder and upper arm level, unspecified arm, initial encounter: Secondary | ICD-10-CM | POA: Insufficient documentation

## 2013-11-05 DIAGNOSIS — R05 Cough: Secondary | ICD-10-CM | POA: Insufficient documentation

## 2013-11-05 DIAGNOSIS — Z79899 Other long term (current) drug therapy: Secondary | ICD-10-CM | POA: Insufficient documentation

## 2013-11-05 DIAGNOSIS — S161XXA Strain of muscle, fascia and tendon at neck level, initial encounter: Secondary | ICD-10-CM

## 2013-11-05 DIAGNOSIS — Y939 Activity, unspecified: Secondary | ICD-10-CM | POA: Insufficient documentation

## 2013-11-05 DIAGNOSIS — E785 Hyperlipidemia, unspecified: Secondary | ICD-10-CM | POA: Insufficient documentation

## 2013-11-05 MED ORDER — AMOXICILLIN 500 MG PO CAPS: 500.0000 mg | ORAL_CAPSULE | Freq: Three times a day (TID) | ORAL | Status: DC

## 2013-11-05 MED ORDER — METHOCARBAMOL 500 MG PO TABS
1000.0000 mg | ORAL_TABLET | Freq: Once | ORAL | Status: AC
  Administered 2013-11-05: 1000 mg via ORAL
  Filled 2013-11-05: qty 2

## 2013-11-05 MED ORDER — METHOCARBAMOL 500 MG PO TABS: 1000.0000 mg | ORAL_TABLET | Freq: Once | ORAL | Status: DC

## 2013-11-05 MED ORDER — HYDROCOD POLST-CHLORPHEN POLST 10-8 MG/5ML PO LQCR: 5.0000 mL | Freq: Two times a day (BID) | ORAL | Status: DC

## 2013-11-05 NOTE — ED Notes (Signed)
1. Pt reports being sick for 4 days w/ cough and sore throat. 2. Neck has been sore for 4 months,  Has to "pop it 5-6 times" due to stiffness. Now all his muscles ache.

## 2013-11-07 NOTE — ED Provider Notes (Signed)
CSN: [id number]     Arrival date & time 11/05/13  1131 History   First MD Initiated Contact with Patient 11/05/13 1310     Chief Complaint  Patient presents with  . Facial Pain    sinus pain  . Sore Throat    x 4 days  . Generalized Body Aches   (Consider location/radiation/quality/duration/timing/severity/associated sxs/prior Treatment) HPI Comments: [name] is a 57 y.o. Male presenting with a 4 day history of uri type symptoms which includes nasal congestion with thick copious rhinorrhea,post nasal drip, forehead pain, sore throat, low grade fever and nonproductive cough.  Symptoms due to not include shortness of breath, chest pain,  Nausea, vomiting or diarrhea.  The patient has taken an otc cough and cold remedy including a decongestant  prior to arrival with no significant improvement in symptoms.    Additionally,  He describes neck pain for the past 4 months.  He has not sought care from his pcp for this problem.  He denies any specific injuries,  But frequently has to "pop it" to reduce pain and stiffness.  He describes muscle aching in his neck and shoulders since he developed his respiratory infection.      The history is provided by the patient.    Past Medical History  Diagnosis Date  . Hypertension   . GERD (gastroesophageal reflux disease)   . Hyperlipidemia   . Enlarged prostate    Past Surgical History  Procedure Laterality Date  . Carpal tunnel release    . Colonoscopy  03/24/2012    Procedure: COLONOSCOPY;  Surgeon: Malissa Hippo, MD;  [facility];  Service: Endoscopy;  Laterality: N/A;  100   No family history on file. History  Substance Use Topics  . Smoking status: Never Smoker   . Smokeless tobacco: Not on file  . Alcohol Use: No    Review of Systems  Constitutional: Positive for fever. Negative for chills.  HENT: Positive for congestion, postnasal drip, rhinorrhea, sinus pressure and sore throat. Negative for ear pain, hearing  loss, trouble swallowing and voice change.   Eyes: Negative for discharge.  Respiratory: Positive for cough. Negative for shortness of breath, wheezing and stridor.   Cardiovascular: Negative for chest pain.  Gastrointestinal: Negative for abdominal pain.  Genitourinary: Negative.   Musculoskeletal: Positive for myalgias and neck pain.    Allergies  Cozaar  Home Medications   Current Outpatient Rx  Name  Route  Sig  Dispense  Refill  . amLODipine (NORVASC) 10 MG tablet   Oral   Take 10 mg by mouth daily.         Marland Kitchen amoxicillin (AMOXIL) 500 MG capsule   Oral   Take 1 capsule (500 mg total) by mouth 3 (three) times daily.   30 capsule   0   . aspirin 81 MG tablet   Oral   Take 81 mg by mouth daily.         . chlorpheniramine-HYDROcodone (TUSSIONEX PENNKINETIC ER) 10-8 MG/5ML LQCR   Oral   Take 5 mLs by mouth 2 (two) times daily.   75 mL   0   . lisinopril (PRINIVIL,ZESTRIL) 10 MG tablet   Oral   Take 10 mg by mouth daily.         . methocarbamol (ROBAXIN) 500 MG tablet   Oral   Take 2 tablets (1,000 mg total) by mouth once.   40 tablet   0   . rosuvastatin (CRESTOR) 10 MG  tablet   Oral   Take 10 mg by mouth daily.          BP 148/86  Pulse 100  Temp(Src) 100.4 F (38 C) (Oral)  Resp 20  Ht 5\' 8"  (1.727 m)  Wt 185 lb (83.915 kg)  BMI 28.14 kg/m2  SpO2 100% Physical Exam  Constitutional: He is oriented to person, place, and time. He appears well-developed and well-nourished.  HENT:  Head: Normocephalic and atraumatic.  Right Ear: Tympanic membrane and ear canal normal.  Left Ear: Tympanic membrane and ear canal normal.  Nose: Mucosal edema and rhinorrhea present. Left sinus exhibits frontal sinus tenderness.  Mouth/Throat: Uvula is midline, oropharynx is clear and moist and mucous membranes are normal. No oropharyngeal exudate, posterior oropharyngeal edema, posterior oropharyngeal erythema or tonsillar abscesses.  Eyes: Conjunctivae are normal.   Neck: Normal range of motion. Spinous process tenderness and muscular tenderness present. No edema, no erythema and normal range of motion present.  Cardiovascular: Normal rate and normal heart sounds.   Pulmonary/Chest: Effort normal. No respiratory distress. He has no wheezes. He has no rales.  Abdominal: Soft. There is no tenderness.  Musculoskeletal: Normal range of motion.  Neurological: He is alert and oriented to person, place, and time.  Skin: Skin is warm and dry. No rash noted.  Psychiatric: He has a normal mood and affect.    ED Course  Procedures (including critical care time) Labs Review Labs Reviewed - No data to display Imaging Review No results found.  EKG Interpretation   None       MDM   1. Sinusitis chronic, frontal   2. Posterolateral cervical muscle strain, initial encounter    Patients labs and/or radiological studies were viewed and considered during the medical decision making and disposition process. Xray results discussed with patient.  He was given robaxin while here and will be prescribed this medicine in addition advised heat tx to neck.  Prescribed amoxil and tussionex for sinusitis, encouraged continue taking otc decongestant.  Rest,  Increased fluids.  Recheck by pcp if not improved over the next week and also further eval of neck pain if this persists.  The patient appears reasonably screened and/or stabilized for discharge and I doubt any other medical condition or other [facility] requiring further screening, evaluation, or treatment in the ED at this time prior to discharge.     Burgess Amor, PA-C 11/08/13 [phone]

## 2013-11-09 NOTE — ED Provider Notes (Signed)
Medical screening examination/treatment/procedure(s) were performed by non-physician practitioner and as supervising physician I was immediately available for consultation/collaboration.  EKG Interpretation   None        Kalika Smay, MD 11/09/13 1054 

## 2014-04-10 ENCOUNTER — Ambulatory Visit: Payer: BC Managed Care – PPO | Admitting: Physical Therapy

## 2014-04-16 ENCOUNTER — Ambulatory Visit: Payer: BC Managed Care – PPO | Attending: Orthopaedic Surgery | Admitting: Physical Therapy

## 2014-04-16 DIAGNOSIS — I10 Essential (primary) hypertension: Secondary | ICD-10-CM | POA: Diagnosis not present

## 2014-04-16 DIAGNOSIS — IMO0001 Reserved for inherently not codable concepts without codable children: Secondary | ICD-10-CM | POA: Diagnosis not present

## 2014-04-16 DIAGNOSIS — M79609 Pain in unspecified limb: Secondary | ICD-10-CM | POA: Insufficient documentation

## 2014-04-16 DIAGNOSIS — M545 Low back pain, unspecified: Secondary | ICD-10-CM | POA: Diagnosis not present

## 2014-04-16 DIAGNOSIS — R5381 Other malaise: Secondary | ICD-10-CM | POA: Insufficient documentation

## 2014-04-23 ENCOUNTER — Ambulatory Visit: Payer: BC Managed Care – PPO | Admitting: *Deleted

## 2014-04-23 DIAGNOSIS — IMO0001 Reserved for inherently not codable concepts without codable children: Secondary | ICD-10-CM | POA: Diagnosis not present

## 2017-03-18 ENCOUNTER — Encounter (INDEPENDENT_AMBULATORY_CARE_PROVIDER_SITE_OTHER): Payer: Self-pay | Admitting: *Deleted

## 2017-06-21 ENCOUNTER — Ambulatory Visit: Payer: Self-pay | Admitting: Physician Assistant

## 2017-06-21 NOTE — H&P (Signed)
[name] is an 61 y.o. male.   Chief Complaint: left shoulder pain, left thumb triggering  HPI: The patient is a 61-year-old male. He was seen here in our [facility] 04-26-2017. He states he woke up with significant pain in the shoulder. Denies any injury or trauma at the onset. Gave him some Pennsaid here. He was told he had a significant degeneration of his AC joint. We elected to obtain MRI showing he has full thickness tears of his cuff. He does physical work. He also has a left trigger thumb. Occasional triggering of his long finger.   Past Medical History:  Diagnosis Date  . Enlarged prostate   . GERD (gastroesophageal reflux disease)   . Hyperlipidemia   . Hypertension     Past Surgical History:  Procedure Laterality Date  . CARPAL TUNNEL RELEASE    . COLONOSCOPY  03/24/2012   Procedure: COLONOSCOPY;  Surgeon: Najeeb U Rehman, MD;  Location[facility];  Service: Endoscopy;  Laterality: N/A;  100    No family history on file. Social History:  reports that he has never smoked. He does not have any smokeless tobacco history on file. He reports that he does not drink alcohol or use drugs.  Allergies:  Allergies  Allergen Reactions  . Cozaar      (Not in a hospital admission)  No results found for this or any previous visit (from the past 48 hour(s)). No results found.  Review of Systems  Musculoskeletal: Positive for joint pain. Negative for falls.  All other systems reviewed and are negative.   There were no vitals taken for this visit. Physical Exam  Constitutional: He is oriented to person, place, and time. He appears well-developed and well-nourished. No distress.  HENT:  Head: Normocephalic and atraumatic.  Eyes: Pupils are equal, round, and reactive to light. Conjunctivae and EOM are normal.  Neck: Normal range of motion. Neck supple.  Cardiovascular: Normal rate and intact distal pulses.   Respiratory: Effort normal. No respiratory  distress.  GI: Soft. He exhibits no distension. There is no tenderness.  Musculoskeletal:  Examination of left upper extremity shows he is neurovascularly intact. He has painful arc of motion and restriction of motion with the left shoulder. He is tender over the AC joint. He has good strength, resisted abduction and rotation but it is painful. Significantly positive impingement with Hawkins testing. Hyperabduction of the arm across the chest.   Also reproducible triggering left thumb, A1pulley TTP.  Neurological: He is alert and oriented to person, place, and time.  Skin: Skin is warm and dry. No rash noted. No erythema.  Psychiatric: He has a normal mood and affect. His behavior is normal.     Assessment/Plan He is a candidate for arthroscopy, acromioplasty and distal clavicle rotator cuff repair of the left shoulder. Definite trigger thumb release left. Questionable symptomatology of the long finger left. He was given a prescription for Percocet and Robaxin. He will continue to try and work. If he calls back he could be considered out of work if he desires it but we will try and keep him working at this point. He will return after surgery, the surgery to be performed sometime in August.   Jazmarie Biever, PA-C 06/21/2017, 12:02 PM   

## 2017-06-23 ENCOUNTER — Encounter (HOSPITAL_BASED_OUTPATIENT_CLINIC_OR_DEPARTMENT_OTHER): Payer: Self-pay | Admitting: *Deleted

## 2017-06-29 NOTE — Pre-Procedure Instructions (Signed)
Spoke to receptionist in Dr Murrell Redden office who will fax labs and EKG to [place].

## 2017-06-30 ENCOUNTER — Ambulatory Visit (HOSPITAL_BASED_OUTPATIENT_CLINIC_OR_DEPARTMENT_OTHER)
Admission: RE | Admit: 2017-06-30 | Discharge: 2017-06-30 | Disposition: A | Discharge status: Home / Self Care | Payer: BLUE CROSS/BLUE SHIELD | Source: Ambulatory Visit | Attending: Orthopedic Surgery | Admitting: Orthopedic Surgery

## 2017-06-30 ENCOUNTER — Encounter (HOSPITAL_BASED_OUTPATIENT_CLINIC_OR_DEPARTMENT_OTHER): Payer: Self-pay | Admitting: *Deleted

## 2017-06-30 ENCOUNTER — Ambulatory Visit (HOSPITAL_BASED_OUTPATIENT_CLINIC_OR_DEPARTMENT_OTHER): Payer: BLUE CROSS/BLUE SHIELD | Admitting: Certified Registered"

## 2017-06-30 ENCOUNTER — Encounter (HOSPITAL_BASED_OUTPATIENT_CLINIC_OR_DEPARTMENT_OTHER)
Admission: RE | Disposition: A | Discharge status: Home / Self Care | Payer: Self-pay | Source: Ambulatory Visit | Attending: Orthopedic Surgery

## 2017-06-30 DIAGNOSIS — M65312 Trigger thumb, left thumb: Secondary | ICD-10-CM | POA: Diagnosis not present

## 2017-06-30 DIAGNOSIS — E119 Type 2 diabetes mellitus without complications: Secondary | ICD-10-CM | POA: Diagnosis not present

## 2017-06-30 DIAGNOSIS — I10 Essential (primary) hypertension: Secondary | ICD-10-CM | POA: Insufficient documentation

## 2017-06-30 DIAGNOSIS — Z7982 Long term (current) use of aspirin: Secondary | ICD-10-CM | POA: Insufficient documentation

## 2017-06-30 DIAGNOSIS — M24112 Other articular cartilage disorders, left shoulder: Secondary | ICD-10-CM | POA: Insufficient documentation

## 2017-06-30 DIAGNOSIS — M75122 Complete rotator cuff tear or rupture of left shoulder, not specified as traumatic: Secondary | ICD-10-CM | POA: Insufficient documentation

## 2017-06-30 DIAGNOSIS — M7542 Impingement syndrome of left shoulder: Secondary | ICD-10-CM | POA: Diagnosis not present

## 2017-06-30 DIAGNOSIS — M19012 Primary osteoarthritis, left shoulder: Secondary | ICD-10-CM | POA: Insufficient documentation

## 2017-06-30 DIAGNOSIS — Z7984 Long term (current) use of oral hypoglycemic drugs: Secondary | ICD-10-CM | POA: Diagnosis not present

## 2017-06-30 DIAGNOSIS — Z79899 Other long term (current) drug therapy: Secondary | ICD-10-CM | POA: Insufficient documentation

## 2017-06-30 DIAGNOSIS — Z79891 Long term (current) use of opiate analgesic: Secondary | ICD-10-CM | POA: Insufficient documentation

## 2017-06-30 DIAGNOSIS — N400 Enlarged prostate without lower urinary tract symptoms: Secondary | ICD-10-CM | POA: Diagnosis not present

## 2017-06-30 HISTORY — DX: Trigger thumb, left thumb: M65.312

## 2017-06-30 HISTORY — DX: Type 2 diabetes mellitus without complications: E11.9

## 2017-06-30 HISTORY — PX: SHOULDER ARTHROSCOPY WITH OPEN ROTATOR CUFF REPAIR AND DISTAL CLAVICLE ACROMINECTOMY: SHX5683

## 2017-06-30 HISTORY — DX: Unspecified osteoarthritis, unspecified site: M19.90

## 2017-06-30 HISTORY — PX: TRIGGER FINGER RELEASE: SHX641

## 2017-06-30 HISTORY — PX: SHOULDER ACROMIOPLASTY: SHX6093

## 2017-06-30 LAB — GLUCOSE, CAPILLARY
Glucose-Capillary: 106 mg/dL — ABNORMAL HIGH (ref 65–99)
Glucose-Capillary: 119 mg/dL — ABNORMAL HIGH (ref 65–99)

## 2017-06-30 SURGERY — SHOULDER ARTHROSCOPY WITH OPEN ROTATOR CUFF REPAIR AND DISTAL CLAVICLE ACROMINECTOMY
Anesthesia: Regional | Site: Thumb | Laterality: Left

## 2017-06-30 MED ORDER — ONDANSETRON HCL 4 MG/2ML IJ SOLN: 4.0000 mg | Freq: Four times a day (QID) | INTRAMUSCULAR | Status: DC | PRN

## 2017-06-30 MED ORDER — FENTANYL CITRATE (PF) 100 MCG/2ML IJ SOLN
INTRAMUSCULAR | Status: AC
  Filled 2017-06-30: qty 2

## 2017-06-30 MED ORDER — PROPOFOL 10 MG/ML IV BOLUS
INTRAVENOUS | Status: AC
  Filled 2017-06-30: qty 20

## 2017-06-30 MED ORDER — FENTANYL CITRATE (PF) 100 MCG/2ML IJ SOLN
50.0000 ug | INTRAMUSCULAR | Status: DC | PRN
  Administered 2017-06-30 (×2): 50 ug via INTRAVENOUS

## 2017-06-30 MED ORDER — ONDANSETRON HCL 4 MG PO TABS: 4.0000 mg | ORAL_TABLET | Freq: Four times a day (QID) | ORAL | Status: DC | PRN

## 2017-06-30 MED ORDER — KETOROLAC TROMETHAMINE 30 MG/ML IJ SOLN
INTRAMUSCULAR | Status: AC
  Filled 2017-06-30: qty 1

## 2017-06-30 MED ORDER — BUPIVACAINE HCL (PF) 0.5 % IJ SOLN
INTRAMUSCULAR | Status: AC
  Filled 2017-06-30: qty 30

## 2017-06-30 MED ORDER — LIDOCAINE HCL (CARDIAC) 20 MG/ML IV SOLN
INTRAVENOUS | Status: DC | PRN
  Administered 2017-06-30: 30 mg via INTRAVENOUS

## 2017-06-30 MED ORDER — HYDROMORPHONE HCL 1 MG/ML IJ SOLN: 0.5000 mg | INTRAMUSCULAR | Status: DC | PRN

## 2017-06-30 MED ORDER — OXYCODONE-ACETAMINOPHEN 5-325 MG PO TABS: 1.0000 | ORAL_TABLET | ORAL | Status: DC | PRN

## 2017-06-30 MED ORDER — SUCCINYLCHOLINE CHLORIDE 20 MG/ML IJ SOLN
INTRAMUSCULAR | Status: DC | PRN
  Administered 2017-06-30: 100 mg via INTRAVENOUS

## 2017-06-30 MED ORDER — OXYCODONE HCL 5 MG PO TABS
5.0000 mg | ORAL_TABLET | Freq: Once | ORAL | Status: AC | PRN
  Administered 2017-06-30: 5 mg via ORAL

## 2017-06-30 MED ORDER — KETOROLAC TROMETHAMINE 30 MG/ML IJ SOLN
30.0000 mg | Freq: Once | INTRAMUSCULAR | Status: DC | PRN
  Administered 2017-06-30: 30 mg via INTRAVENOUS

## 2017-06-30 MED ORDER — ONDANSETRON HCL 4 MG/2ML IJ SOLN
INTRAMUSCULAR | Status: DC | PRN
  Administered 2017-06-30: 4 mg via INTRAVENOUS

## 2017-06-30 MED ORDER — SODIUM CHLORIDE 0.9 % IR SOLN
Status: DC | PRN
  Administered 2017-06-30: 3000 mL

## 2017-06-30 MED ORDER — OXYCODONE HCL 5 MG PO TABS
ORAL_TABLET | ORAL | Status: AC
Start: 2017-06-30 — End: 2017-06-30
  Filled 2017-06-30: qty 1

## 2017-06-30 MED ORDER — SODIUM CHLORIDE 0.9 % IV SOLN: INTRAVENOUS | Status: DC

## 2017-06-30 MED ORDER — CEFAZOLIN SODIUM-DEXTROSE 2-4 GM/100ML-% IV SOLN
INTRAVENOUS | Status: AC
  Filled 2017-06-30: qty 100

## 2017-06-30 MED ORDER — METHOCARBAMOL 500 MG PO TABS: 500.0000 mg | ORAL_TABLET | Freq: Four times a day (QID) | ORAL | Status: DC | PRN

## 2017-06-30 MED ORDER — MIDAZOLAM HCL 2 MG/2ML IJ SOLN
1.0000 mg | INTRAMUSCULAR | Status: DC | PRN
  Administered 2017-06-30: 2 mg via INTRAVENOUS

## 2017-06-30 MED ORDER — DEXAMETHASONE SODIUM PHOSPHATE 4 MG/ML IJ SOLN
INTRAMUSCULAR | Status: DC | PRN
  Administered 2017-06-30: 4 mg via INTRAVENOUS

## 2017-06-30 MED ORDER — OXYCODONE-ACETAMINOPHEN 5-325 MG PO TABS: 1.0000 | ORAL_TABLET | ORAL | 0 refills | Status: AC | PRN

## 2017-06-30 MED ORDER — MEPERIDINE HCL 25 MG/ML IJ SOLN: 6.2500 mg | INTRAMUSCULAR | Status: DC | PRN

## 2017-06-30 MED ORDER — ONDANSETRON HCL 4 MG/2ML IJ SOLN
INTRAMUSCULAR | Status: AC
  Filled 2017-06-30: qty 4

## 2017-06-30 MED ORDER — METOCLOPRAMIDE HCL 5 MG/ML IJ SOLN: 5.0000 mg | Freq: Three times a day (TID) | INTRAMUSCULAR | Status: DC | PRN

## 2017-06-30 MED ORDER — LACTATED RINGERS IV SOLN
INTRAVENOUS | Status: DC
  Administered 2017-06-30 (×3): via INTRAVENOUS

## 2017-06-30 MED ORDER — METOCLOPRAMIDE HCL 5 MG PO TABS: 5.0000 mg | ORAL_TABLET | Freq: Three times a day (TID) | ORAL | Status: DC | PRN

## 2017-06-30 MED ORDER — PROMETHAZINE HCL 25 MG/ML IJ SOLN: 6.2500 mg | INTRAMUSCULAR | Status: DC | PRN

## 2017-06-30 MED ORDER — METHOCARBAMOL 500 MG PO TABS: 500.0000 mg | ORAL_TABLET | Freq: Four times a day (QID) | ORAL | 0 refills | Status: DC | PRN

## 2017-06-30 MED ORDER — BUPIVACAINE HCL (PF) 0.5 % IJ SOLN
INTRAMUSCULAR | Status: DC | PRN
  Administered 2017-06-30: 10 mL

## 2017-06-30 MED ORDER — DOCUSATE SODIUM 100 MG PO CAPS: 100.0000 mg | ORAL_CAPSULE | Freq: Two times a day (BID) | ORAL | Status: DC

## 2017-06-30 MED ORDER — CHLORHEXIDINE GLUCONATE 4 % EX LIQD: 60.0000 mL | Freq: Once | CUTANEOUS | Status: DC

## 2017-06-30 MED ORDER — SCOPOLAMINE 1 MG/3DAYS TD PT72: 1.0000 | MEDICATED_PATCH | Freq: Once | TRANSDERMAL | Status: DC | PRN

## 2017-06-30 MED ORDER — DEXAMETHASONE SODIUM PHOSPHATE 10 MG/ML IJ SOLN
INTRAMUSCULAR | Status: AC
  Filled 2017-06-30: qty 1

## 2017-06-30 MED ORDER — MIDAZOLAM HCL 2 MG/2ML IJ SOLN
INTRAMUSCULAR | Status: AC
  Filled 2017-06-30: qty 2

## 2017-06-30 MED ORDER — METHOCARBAMOL 1000 MG/10ML IJ SOLN: 500.0000 mg | Freq: Four times a day (QID) | INTRAVENOUS | Status: DC | PRN

## 2017-06-30 MED ORDER — CEFAZOLIN SODIUM-DEXTROSE 2-4 GM/100ML-% IV SOLN
2.0000 g | INTRAVENOUS | Status: AC
  Administered 2017-06-30: 2 g via INTRAVENOUS

## 2017-06-30 MED ORDER — BUPIVACAINE-EPINEPHRINE (PF) 0.5% -1:200000 IJ SOLN
INTRAMUSCULAR | Status: DC | PRN
  Administered 2017-06-30: 25 mL via PERINEURAL

## 2017-06-30 MED ORDER — PROPOFOL 10 MG/ML IV BOLUS
INTRAVENOUS | Status: DC | PRN
  Administered 2017-06-30: 150 mg via INTRAVENOUS

## 2017-06-30 SURGICAL SUPPLY — 104 items
ANCHOR SUT BIO SW 4.75X19.1 (Anchor) ×8 IMPLANT
BANDAGE ACE 3X5.8 VEL STRL LF (GAUZE/BANDAGES/DRESSINGS) IMPLANT
BANDAGE COBAN STERILE 2 (GAUZE/BANDAGES/DRESSINGS) IMPLANT
BENZOIN TINCTURE PRP APPL 2/3 (GAUZE/BANDAGES/DRESSINGS) IMPLANT
BLADE 4.2CUDA (BLADE) ×4 IMPLANT
BLADE AVERAGE 25MMX9MM (BLADE) ×1
BLADE AVERAGE 25X9 (BLADE) ×3 IMPLANT
BLADE CUTTER GATOR 3.5 (BLADE) IMPLANT
BLADE MINI RND TIP GREEN BEAV (BLADE) IMPLANT
BLADE SURG 15 STRL LF DISP TIS (BLADE) ×2 IMPLANT
BLADE SURG 15 STRL SS (BLADE) ×2
BLADE VORTEX 6.0 (BLADE) IMPLANT
BNDG CONFORM 3 STRL LF (GAUZE/BANDAGES/DRESSINGS) ×4 IMPLANT
BNDG ELASTIC 2X5.8 VLCR STR LF (GAUZE/BANDAGES/DRESSINGS) ×8 IMPLANT
BNDG ESMARK 4X9 LF (GAUZE/BANDAGES/DRESSINGS) ×4 IMPLANT
BUR 3.5 LG SPHERICAL (BURR) IMPLANT
BUR EGG 3PK/BX (BURR) ×4 IMPLANT
BUR OVAL 4.0 (BURR) IMPLANT
BUR OVAL 6.0 (BURR) IMPLANT
BUR VERTEX HOODED 4.5 (BURR) IMPLANT
BURR 3.5 LG SPHERICAL (BURR)
BURR 3.5MM LG SPHERICAL (BURR)
CANNULA SHOULDER 7CM (CANNULA) ×4 IMPLANT
CANNULA TWIST IN 8.25X7CM (CANNULA) IMPLANT
CLEANER CAUTERY TIP 5X5 PAD (MISCELLANEOUS) IMPLANT
CLOSURE WOUND 1/2 X4 (GAUZE/BANDAGES/DRESSINGS) ×1
CORD BIPOLAR FORCEPS 12FT (ELECTRODE) ×4 IMPLANT
COVER BACK TABLE 60X90IN (DRAPES) ×4 IMPLANT
COVER MAYO STAND STRL (DRAPES) ×4 IMPLANT
CUTTER MENISCUS  4.2MM (BLADE)
CUTTER MENISCUS 4.2MM (BLADE) IMPLANT
DECANTER SPIKE VIAL GLASS SM (MISCELLANEOUS) IMPLANT
DRAPE EXTREMITY T 121X128X90 (DRAPE) IMPLANT
DRAPE STERI 35X30 U-POUCH (DRAPES) ×4 IMPLANT
DRAPE SURG 17X23 STRL (DRAPES) ×4 IMPLANT
DRAPE U-SHAPE 76X120 STRL (DRAPES) ×8 IMPLANT
DRSG EMULSION OIL 3X3 NADH (GAUZE/BANDAGES/DRESSINGS) ×4 IMPLANT
DRSG PAD ABDOMINAL 8X10 ST (GAUZE/BANDAGES/DRESSINGS) ×4 IMPLANT
DURAPREP 26ML APPLICATOR (WOUND CARE) ×4 IMPLANT
ELECT REM PT RETURN 9FT ADLT (ELECTROSURGICAL) ×4
ELECTRODE REM PT RTRN 9FT ADLT (ELECTROSURGICAL) ×2 IMPLANT
GAUZE SPONGE 4X4 12PLY STRL (GAUZE/BANDAGES/DRESSINGS) ×4 IMPLANT
GLOVE BIO SURGEON STRL SZ7.5 (GLOVE) ×4 IMPLANT
GLOVE BIO SURGEON STRL SZ8 (GLOVE) ×8 IMPLANT
GLOVE BIOGEL PI IND STRL 8 (GLOVE) ×4 IMPLANT
GLOVE BIOGEL PI INDICATOR 8 (GLOVE) ×4
GLOVE SURG ORTHO 8.0 STRL STRW (GLOVE) ×4 IMPLANT
GOWN STRL REUS W/ TWL LRG LVL3 (GOWN DISPOSABLE) ×4 IMPLANT
GOWN STRL REUS W/ TWL XL LVL3 (GOWN DISPOSABLE) ×2 IMPLANT
GOWN STRL REUS W/TWL LRG LVL3 (GOWN DISPOSABLE) ×4
GOWN STRL REUS W/TWL XL LVL3 (GOWN DISPOSABLE) ×6 IMPLANT
MANIFOLD NEPTUNE II (INSTRUMENTS) ×4 IMPLANT
NEEDLE 1/2 CIR CATGUT .05X1.09 (NEEDLE) IMPLANT
NEEDLE HYPO 25X1 1.5 SAFETY (NEEDLE) ×4 IMPLANT
NEEDLE SCORPION MULTI FIRE (NEEDLE) ×4 IMPLANT
NS IRRIG 1000ML POUR BTL (IV SOLUTION) ×4 IMPLANT
PACK ARTHROSCOPY DSU (CUSTOM PROCEDURE TRAY) ×4 IMPLANT
PACK BASIN DAY SURGERY FS (CUSTOM PROCEDURE TRAY) ×4 IMPLANT
PAD CLEANER CAUTERY TIP 5X5 (MISCELLANEOUS)
PAD ORTHO SHOULDER 7X19 LRG (SOFTGOODS) ×4 IMPLANT
PADDING CAST ABS 3INX4YD NS (CAST SUPPLIES)
PADDING CAST ABS 4INX4YD NS (CAST SUPPLIES) ×2
PADDING CAST ABS COTTON 3X4 (CAST SUPPLIES) IMPLANT
PADDING CAST ABS COTTON 4X4 ST (CAST SUPPLIES) ×2 IMPLANT
PENCIL BUTTON HOLSTER BLD 10FT (ELECTRODE) ×4 IMPLANT
PROBE BIPOLAR ATHRO 135MM 90D (MISCELLANEOUS) ×4 IMPLANT
RESTRAINT HEAD UNIVERSAL NS (MISCELLANEOUS) ×4 IMPLANT
SET ARTHROSCOPY TUBING (MISCELLANEOUS) ×2
SET ARTHROSCOPY TUBING LN (MISCELLANEOUS) ×2 IMPLANT
SLEEVE SCD COMPRESS KNEE MED (MISCELLANEOUS) ×4 IMPLANT
SLING ARM FOAM STRAP LRG (SOFTGOODS) IMPLANT
SLING ARM MED ADULT FOAM STRAP (SOFTGOODS) IMPLANT
SLING ULTRA II MEDIUM (SOFTGOODS) IMPLANT
SLING ULTRA II SMALL (SOFTGOODS) IMPLANT
SPONGE LAP 4X18 X RAY DECT (DISPOSABLE) ×4 IMPLANT
STAPLER VISISTAT 35W (STAPLE) IMPLANT
STOCKINETTE 4X48 STRL (DRAPES) ×4 IMPLANT
STRIP CLOSURE SKIN 1/2X4 (GAUZE/BANDAGES/DRESSINGS) ×3 IMPLANT
SUCTION FRAZIER HANDLE 10FR (MISCELLANEOUS) ×2
SUCTION TUBE FRAZIER 10FR DISP (MISCELLANEOUS) ×2 IMPLANT
SUT BONE WAX W31G (SUTURE) IMPLANT
SUT ETHILON 3 0 PS 1 (SUTURE) ×4 IMPLANT
SUT ETHILON 4 0 PS 2 18 (SUTURE) ×4 IMPLANT
SUT ETHILON 5 0 P 3 18 (SUTURE)
SUT FIBERWIRE #2 38 T-5 BLUE (SUTURE)
SUT MNCRL AB 3-0 PS2 18 (SUTURE) ×4 IMPLANT
SUT NYLON ETHILON 5-0 P-3 1X18 (SUTURE) IMPLANT
SUT TICRON 1 T 12 (SUTURE) ×4 IMPLANT
SUT TIGER TAPE 7 IN WHITE (SUTURE) ×4 IMPLANT
SUT VIC AB 0 CT1 27 (SUTURE)
SUT VIC AB 0 CT1 27XBRD ANBCTR (SUTURE) IMPLANT
SUT VIC AB 1 CT1 27 (SUTURE)
SUT VIC AB 1 CT1 27XBRD ANBCTR (SUTURE) IMPLANT
SUT VIC AB 2-0 SH 27 (SUTURE)
SUT VIC AB 2-0 SH 27XBRD (SUTURE) IMPLANT
SUTURE FIBERWR #2 38 T-5 BLUE (SUTURE) IMPLANT
SYR BULB 3OZ (MISCELLANEOUS) ×4 IMPLANT
SYR CONTROL 10ML LL (SYRINGE) IMPLANT
TAPE FIBER 2MM 7IN #2 BLUE (SUTURE) ×4 IMPLANT
TOWEL OR 17X24 6PK STRL BLUE (TOWEL DISPOSABLE) ×4 IMPLANT
TOWEL OR NON WOVEN STRL DISP B (DISPOSABLE) ×4 IMPLANT
UNDERPAD 30X30 (UNDERPADS AND DIAPERS) ×4 IMPLANT
WATER STERILE IRR 1000ML POUR (IV SOLUTION) ×4 IMPLANT
YANKAUER SUCT BULB TIP NO VENT (SUCTIONS) ×4 IMPLANT

## 2017-06-30 NOTE — Progress Notes (Addendum)
Assisted Dr. Lissa Hoard with left, ultrasound guided, interscalene  block. Side rails up, monitors on throughout procedure. See vital signs in flow sheet. Tolerated Procedure well. Block completed at 1225hrs.

## 2017-06-30 NOTE — Discharge Instructions (Signed)
Diet: As you were doing prior to hospitalization   Activity: Increase activity slowly as tolerated  No lifting or driving for 6 weeks   Shower: May shower without a dressing on post op day #3, NO SOAKING in tub   Dressing: You may change your dressing on post op day #3.  Then change the dressing daily with sterile 4"x4"s gauze dressing  Or band aids  Weight Bearing: nonweight bearing left arm.  Remain in sling except for shower and pendulum exercises (keep arm close to body)  To prevent constipation: you may use a stool softener such as -  Colace ( over the counter) 100 mg by mouth twice a day  Drink plenty of fluids ( prune juice may be helpful) and high fiber foods  Miralax ( over the counter) for constipation as needed.   Precautions: If you experience chest pain or shortness of breath - call 911 immediately For transfer to the hospital emergency department!!  If you develop a fever greater that 101 F, purulent drainage from wound, increased redness or drainage from wound, or calf pain -- Call the office   Follow- Up Appointment: Please call for an appointment to be seen in 1 week or as previously scheduled [facility] - [phone]   Post Anesthesia Home Care Instructions  Activity: Get plenty of rest for the remainder of the day. A responsible individual must stay with you for 24 hours following the procedure.  For the next 24 hours, DO NOT: -Drive a car -Paediatric nurse -Drink alcoholic beverages -Take any medication unless instructed by your physician -Make any legal decisions or sign important papers.  Meals: Start with liquid foods such as gelatin or soup. Progress to regular foods as tolerated. Avoid greasy, spicy, heavy foods. If nausea and/or vomiting occur, drink only clear liquids until the nausea and/or vomiting subsides. Call your physician if vomiting continues.  Special Instructions/Symptoms: Your throat may feel dry or sore from the anesthesia or the  breathing tube placed in your throat during surgery. If this causes discomfort, gargle with warm salt water. The discomfort should disappear within 24 hours.  If you had a scopolamine patch placed behind your ear for the management of post- operative nausea and/or vomiting:  1. The medication in the patch is effective for 72 hours, after which it should be removed.  Wrap patch in a tissue and discard in the trash. Wash hands thoroughly with soap and water. 2. You may remove the patch earlier than 72 hours if you experience unpleasant side effects which may include dry mouth, dizziness or visual disturbances. 3. Avoid touching the patch. Wash your hands with soap and water after contact with the patch.   Regional Anesthesia Blocks  1. Numbness or the inability to move the "blocked" extremity may last from 3-48 hours after placement. The length of time depends on the medication injected and your individual response to the medication. If the numbness is not going away after 48 hours, call your surgeon.  2. The extremity that is blocked will need to be protected until the numbness is gone and the  Strength has returned. Because you cannot feel it, you will need to take extra care to avoid injury. Because it may be weak, you may have difficulty moving it or using it. You may not know what position it is in without looking at it while the block is in effect.  3. For blocks in the legs and feet, returning to weight bearing and walking needs to  be done carefully. You will need to wait until the numbness is entirely gone and the strength has returned. You should be able to move your leg and foot normally before you try and bear weight or walk. You will need someone to be with you when you first try to ensure you do not fall and possibly risk injury.  4. Bruising and tenderness at the needle site are common side effects and will resolve in a few days.  5. Persistent numbness or new problems with movement  should be communicated to the surgeon or the [place] [phone] [place] [phone]).

## 2017-06-30 NOTE — Anesthesia Procedure Notes (Signed)
Procedure Name: Intubation Date/Time: 06/30/2017 12:40 PM Performed by: Calen Posch D Pre-anesthesia Checklist: Patient identified, Emergency Drugs available, Suction available and Patient being monitored Patient Re-evaluated:Patient Re-evaluated prior to induction Oxygen Delivery Method: Circle system utilized Preoxygenation: Pre-oxygenation with 100% oxygen Induction Type: IV induction Ventilation: Mask ventilation without difficulty Laryngoscope Size: Mac and 3 Grade View: Grade I Tube type: Oral Tube size: 7.0 mm Number of attempts: 1 Airway Equipment and Method: Stylet and Oral airway Placement Confirmation: ETT inserted through vocal cords under direct vision,  positive ETCO2 and breath sounds checked- equal and bilateral Secured at: 21 cm Tube secured with: Tape Dental Injury: Teeth and Oropharynx as per pre-operative assessment

## 2017-06-30 NOTE — Interval H&P Note (Signed)
History and Physical Interval Note:  06/30/2017 11:04 AM  [name]  has presented today for surgery, with the diagnosis of left shoulder rotator cuff tear with left thumb trigger finger  The various methods of treatment have been discussed with the patient and family. After consideration of risks, benefits and other options for treatment, the patient has consented to  Procedure(s): LEFT SHOULDER ARTHROSCOPY WITH OPEN ROTATOR CUFF REPAIR, OPEN DISTAL CLAVICLE EXCISION (Left) RELEASE TRIGGER FINGER/A-1 PULLEY LEFT THUMB (Left) as a surgical intervention .  The patient's history has been reviewed, patient examined, no change in status, stable for surgery.  I have reviewed the patient's chart and labs.  Questions were answered to the patient's satisfaction.     Charene Mccallister JR,W D

## 2017-06-30 NOTE — Anesthesia Procedure Notes (Addendum)
Anesthesia Regional Block: Interscalene brachial plexus block   Pre-Anesthetic Checklist: ,, timeout performed, Correct Patient, Correct Site, Correct Laterality, Correct Procedure, Correct Position, site marked, Risks and benefits discussed,  Surgical consent,  Pre-op evaluation,  At surgeon's request and post-op pain management  Laterality: Left  Prep: chloraprep       Needles:  Injection technique: Single-shot  Needle Type: Stimulator Needle - 40     Needle Length: 4cm  Needle Gauge: 22     Additional Needles:   Procedures: ultrasound guided,,,,,,,,  Narrative:  Start time: 06/30/2017 12:22 PM End time: 06/30/2017 12:25 PM Injection made incrementally with aspirations every 5 mL. Anesthesiologist: Nolon Nations  Additional Notes: BP cuff, EKG monitors applied. Sedation begun. Nerve location verified with U/S. Anesthetic injected incrementally, slowly , and after neg aspirations under direct u/s guidance. Good perineural spread. Tolerated well. Unable to do block prior to this time as patient did not have an IV.

## 2017-06-30 NOTE — Interval H&P Note (Signed)
History and Physical Interval Note:  06/30/2017 11:07 AM  [name]  has presented today for surgery, with the diagnosis of left shoulder rotator cuff tear with left thumb trigger finger  The various methods of treatment have been discussed with the patient and family. After consideration of risks, benefits and other options for treatment, the patient has consented to  Procedure(s): LEFT SHOULDER ARTHROSCOPY WITH OPEN ROTATOR CUFF REPAIR, OPEN DISTAL CLAVICLE EXCISION (Left) RELEASE TRIGGER FINGER/A-1 PULLEY LEFT THUMB (Left) as a surgical intervention .  The patient's history has been reviewed, patient examined, no change in status, stable for surgery.  I have reviewed the patient's chart and labs.  Questions were answered to the patient's satisfaction.     Remberto Lienhard JR,W D

## 2017-06-30 NOTE — Transfer of Care (Signed)
Immediate Anesthesia Transfer of Care Note  Patient: [name]  Procedure(s) Performed: Procedure(s): LEFT SHOULDER ARTHROSCOPIC DEBRIDEMENT, WITH OPEN ROTATOR CUFF REPAIR, OPEN DISTAL CLAVICLE EXCISION, ACROMIOPLASTY (Left) RELEASE TRIGGER FINGER/A-1 PULLEY LEFT THUMB (Left) SHOULDER ACROMIOPLASTY (Left)  Patient Location: PACU  Anesthesia Type:GA combined with regional for post-op pain  Level of Consciousness: awake and patient cooperative  Airway & Oxygen Therapy: Patient Spontanous Breathing and Patient connected to face mask oxygen  Post-op Assessment: Report given to RN and Post -op Vital signs reviewed and stable  Post vital signs: Reviewed and stable  Last Vitals:  Vitals:   06/30/17 1225 06/30/17 1230  BP: (!) 146/75 (!) 157/97  Pulse: 99 (!) 117  Resp: (!) 22 (!) 25  Temp:    SpO2: 100% 97%    Last Pain:  Vitals:   06/30/17 1225  TempSrc:   PainSc: 0-No pain      Patients Stated Pain Goal: 3 (69/79/48 0165)  Complications: No apparent anesthesia complications

## 2017-06-30 NOTE — Anesthesia Preprocedure Evaluation (Signed)
Anesthesia Evaluation  Patient identified by MRN, date of birth, ID band Patient awake    Reviewed: Allergy & Precautions, NPO status , Patient's Chart, lab work & pertinent test results  Airway Mallampati: II  TM Distance: >3 FB Neck ROM: Full    Dental no notable dental hx.    Pulmonary neg pulmonary ROS,    Pulmonary exam normal breath sounds clear to auscultation       Cardiovascular hypertension, Pt. on medications Normal cardiovascular exam Rhythm:Regular Rate:Normal     Neuro/Psych negative neurological ROS  negative psych ROS   GI/Hepatic Neg liver ROS, GERD  ,  Endo/Other  diabetes, Type 2  Renal/GU negative Renal ROS     Musculoskeletal negative musculoskeletal ROS (+) Arthritis ,   Abdominal   Peds  Hematology negative hematology ROS (+)   Anesthesia Other Findings   Reproductive/Obstetrics negative OB ROS                            Anesthesia Physical Anesthesia Plan  ASA: II  Anesthesia Plan: General and Regional   Post-op Pain Management: GA combined w/ Regional for post-op pain   Induction: Intravenous  PONV Risk Score and Plan: 2 and Ondansetron, Dexamethasone and Midazolam  Airway Management Planned: Oral ETT  Additional Equipment:   Intra-op Plan:   Post-operative Plan: Extubation in OR  Informed Consent: I have reviewed the patients History and Physical, chart, labs and discussed the procedure including the risks, benefits and alternatives for the proposed anesthesia with the patient or authorized representative who has indicated his/her understanding and acceptance.   Dental advisory given  Plan Discussed with: CRNA  Anesthesia Plan Comments:         Anesthesia Quick Evaluation

## 2017-06-30 NOTE — H&P (View-Only) (Signed)
[name] is an 61 y.o. male.   Chief Complaint: left shoulder pain, left thumb triggering  HPI: The patient is a 61 year old male. He was seen here in our [facility] 04-26-2017. He states he woke up with significant pain in the shoulder. Denies any injury or trauma at the onset. Gave him some Pennsaid here. He was told he had a significant degeneration of his AC joint. We elected to obtain MRI showing he has full thickness tears of his cuff. He does physical work. He also has a left trigger thumb. Occasional triggering of his long finger.   Past Medical History:  Diagnosis Date  . Enlarged prostate   . GERD (gastroesophageal reflux disease)   . Hyperlipidemia   . Hypertension     Past Surgical History:  Procedure Laterality Date  . CARPAL TUNNEL RELEASE    . COLONOSCOPY  03/24/2012   Procedure: COLONOSCOPY;  Surgeon: Rogene Houston, MD;  [facility];  Service: Endoscopy;  Laterality: N/A;  100    No family history on file. Social History:  reports that he has never smoked. He does not have any smokeless tobacco history on file. He reports that he does not drink alcohol or use drugs.  Allergies:  Allergies  Allergen Reactions  . Cozaar      (Not in a hospital admission)  No results found for this or any previous visit (from the past 48 hour(s)). No results found.  Review of Systems  Musculoskeletal: Positive for joint pain. Negative for falls.  All other systems reviewed and are negative.   There were no vitals taken for this visit. Physical Exam  Constitutional: He is oriented to person, place, and time. He appears well-developed and well-nourished. No distress.  HENT:  Head: Normocephalic and atraumatic.  Eyes: Pupils are equal, round, and reactive to light. Conjunctivae and EOM are normal.  Neck: Normal range of motion. Neck supple.  Cardiovascular: Normal rate and intact distal pulses.   Respiratory: Effort normal. No respiratory  distress.  GI: Soft. He exhibits no distension. There is no tenderness.  Musculoskeletal:  Examination of left upper extremity shows he is neurovascularly intact. He has painful arc of motion and restriction of motion with the left shoulder. He is tender over the [facility] joint. He has good strength, resisted abduction and rotation but it is painful. Significantly positive impingement with Hawkins testing. Hyperabduction of the arm across the chest.   Also reproducible triggering left thumb, A1pulley TTP.  Neurological: He is alert and oriented to person, place, and time.  Skin: Skin is warm and dry. No rash noted. No erythema.  Psychiatric: He has a normal mood and affect. His behavior is normal.     Assessment/Plan He is a candidate for arthroscopy, acromioplasty and distal clavicle rotator cuff repair of the left shoulder. Definite trigger thumb release left. Questionable symptomatology of the long finger left. He was given a prescription for Percocet and Robaxin. He will continue to try and work. If he calls back he could be considered out of work if he desires it but we will try and keep him working at this point. He will return after surgery, the surgery to be performed sometime in August.   Chriss Czar, [place] 06/21/2017, 12:02 PM

## 2017-06-30 NOTE — Brief Op Note (Signed)
06/30/2017  2:15 PM  PATIENT:  [name]  61 y.o. male  PRE-OPERATIVE DIAGNOSIS:  left shoulder rotator cuff tear with left thumb trigger finger  POST-OPERATIVE DIAGNOSIS:  left shoulder rotator cuff tear with left thumb trigger finger  PROCEDURE:  Procedure(s): LEFT SHOULDER ARTHROSCOPIC DEBRIDEMENT, WITH OPEN ROTATOR CUFF REPAIR, OPEN DISTAL CLAVICLE EXCISION, ACROMIOPLASTY (Left) RELEASE TRIGGER FINGER/A-1 PULLEY LEFT THUMB (Left) SHOULDER ACROMIOPLASTY (Left)  SURGEON:  Surgeon(s) and Role:    Earlie Server, MD - Primary  PHYSICIAN ASSISTANT: Chriss Czar, PA-C  ASSISTANTS:  ANESTHESIA:   local, regional and general  EBL:  Total I/O In: 1300 [I.V.:1300] Out: -   BLOOD ADMINISTERED:none  DRAINS: none   LOCAL MEDICATIONS USED:  MARCAINE     SPECIMEN:  No Specimen  DISPOSITION OF SPECIMEN:  N/A  COUNTS:  YES  TOURNIQUET:   Total Tourniquet Time Documented: Forearm (Left) - 13 minutes Total: Forearm (Left) - 13 minutes   DICTATION: .Other Dictation: Dictation Number unknown  PLAN OF CARE: Discharge to home after PACU  PATIENT DISPOSITION:  PACU - hemodynamically stable.   Delay start of Pharmacological VTE agent (>24hrs) due to surgical blood loss or risk of bleeding: not applicable

## 2017-06-30 NOTE — Interval H&P Note (Signed)
History and Physical Interval Note:  06/30/2017 11:04 AM  [name]  has presented today for surgery, with the diagnosis of left shoulder rotator cuff tear with left thumb trigger finger  The various methods of treatment have been discussed with the patient and family. After consideration of risks, benefits and other options for treatment, the patient has consented to  Procedure(s): LEFT SHOULDER ARTHROSCOPY WITH OPEN ROTATOR CUFF REPAIR, OPEN DISTAL CLAVICLE EXCISION (Left) RELEASE TRIGGER FINGER/A-1 PULLEY LEFT THUMB (Left) as a surgical intervention .  The patient's history has been reviewed, patient examined, no change in status, stable for surgery.  I have reviewed the patient's chart and labs.  Questions were answered to the patient's satisfaction.     Faustina Gebert JR,W D   

## 2017-07-01 ENCOUNTER — Encounter (HOSPITAL_BASED_OUTPATIENT_CLINIC_OR_DEPARTMENT_OTHER): Payer: Self-pay | Admitting: Orthopedic Surgery

## 2017-07-01 NOTE — Op Note (Signed)
Luis, Rivera NO.:  [id number]  MEDICAL RECORD NO.:  [id number]  LOCATION:                                 FACILITY:  PHYSICIAN:  Lockie Pares, M.D.         DATE OF BIRTH:  DATE OF PROCEDURE:  06/30/2017 DATE OF DISCHARGE:                              OPERATIVE REPORT   PREOPERATIVE DIAGNOSES: 1. Complete rotator cuff tear. 2. Impingement. 3. Acromioclavicular joint arthritis. 4. Degenerative tearing of anterior superior labrum. 5. Left trigger thumb.  POSTOPERATIVE DIAGNOSES: 1. Complete rotator cuff tear. 2. Impingement. 3. Acromioclavicular joint arthritis. 4. Degenerative tearing of anterior superior labrum. 5. Left trigger thumb.  OPERATION: 1. Open rotator cuff repair and acromioplasty. 2. Arthroscopic debridement, torn labrum (extensive). 3. Open distal clavicle resection. 4. Release left trigger thumb.  SURGEON:  Lockie Pares, M.D.  ASSISTANTMarjo Bicker, PA.  ANESTHESIA:  General, nerve block.  DESCRIPTION OF PROCEDURE:  Initial procedure was directed at the left trigger thumb.  A sterile forearm tourniquet was used with exsanguination of leg, inflation to 250.  Incision was made over the annular pulley to the thumb transversely.  Protection of the digital nerve was noted.  We released the annular pulley, released the triggering of the thumb without difficulty.  Closure was affected with interrupted nylon.  It was infiltrated with plain Marcaine.  Sterile dressing was then applied.  Attention was then directed to the shoulder, beach chair positioning. We placed posterior and anterior portals.  Systematic inspection of the shoulder showed significant tearing of anterior superior labrum, debrided.  Complete rotator cuff tear, full thickness, probably about 2- 3 cm tear with attenuation of the tissue noted.  Aggressive intra- articular debridement with arthroscope was carried out, followed by converting this to a procedure with  an incision bisecting the acromial AC interval.  Severe AC arthropathy and significant impingement were noted from the acromion.  Excised a centimeter to centimeter and a half of the clavicle, performed acromioplasty.  Bursa was extremely hypertrophied and thickened.  Bursectomy carried out.  We freshened the edges of the tear with a 15 blade followed by burring the tuberosity, elected to fix this with a medial and lateral row technique, specifically one 4.5 Arthrex SwiveLock medially with 2 FiberTape and 1 lateral row SwiveLock.  This essentially created watertight repair.  The wound was irrigated.  Closure of the deltoid, which had been split, was done with #1 Ethibond.  The subcutaneous tissue with 2-0 Vicryl, skin with Monocryl.  Lightly compressive sterile dressing and a sling applied.  Taken to recovery room in stable condition.     Lockie Pares, M.D.   ______________________________ Lockie Pares, M.D.    WDC/MEDQ  D:  06/30/2017  T:  06/30/2017  Job:  [id number]

## 2017-07-01 NOTE — Anesthesia Postprocedure Evaluation (Signed)
Anesthesia Post Note  Patient: WESAM GEARHART  Procedure(s) Performed: Procedure(s) (LRB): LEFT SHOULDER ARTHROSCOPIC DEBRIDEMENT, WITH OPEN ROTATOR CUFF REPAIR, OPEN DISTAL CLAVICLE EXCISION, ACROMIOPLASTY (Left) RELEASE TRIGGER FINGER/A-1 PULLEY LEFT THUMB (Left) SHOULDER ACROMIOPLASTY (Left)     Patient location during evaluation: PACU Anesthesia Type: Regional and General Level of consciousness: sedated and patient cooperative Pain management: pain level controlled Vital Signs Assessment: post-procedure vital signs reviewed and stable Respiratory status: spontaneous breathing Cardiovascular status: stable Anesthetic complications: no    Last Vitals:  Vitals:   06/30/17 1545 06/30/17 1600  BP: (!) 142/89 138/88  Pulse: 94 100  Resp: 18 16  Temp: 36.5 C (!) 36.4 C  SpO2: 94% 93%    Last Pain:  Vitals:   07/01/17 0858  TempSrc:   PainSc: [place]

## 2017-07-13 ENCOUNTER — Ambulatory Visit: Payer: BLUE CROSS/BLUE SHIELD | Attending: Orthopedic Surgery | Admitting: Physical Therapy

## 2017-07-13 DIAGNOSIS — M25512 Pain in left shoulder: Secondary | ICD-10-CM | POA: Insufficient documentation

## 2017-07-13 DIAGNOSIS — M25612 Stiffness of left shoulder, not elsewhere classified: Secondary | ICD-10-CM

## 2017-07-13 DIAGNOSIS — G8929 Other chronic pain: Secondary | ICD-10-CM | POA: Diagnosis present

## 2017-07-13 NOTE — Patient Instructions (Signed)
Countertop stretch to increase passive left shoulder flexion.

## 2017-07-13 NOTE — Therapy (Signed)
[facility] [place], [place], [address] Phone: [phone]   Fax:  [phone]  Physical Therapy Evaluation  Patient Details  Name: [name] MRN: [id number] Date of Birth: 1956/03/17 Referring Provider: Earlie Server MD.  Encounter Date: 07/13/2017      PT End of Session - 07/13/17 1356    Visit Number 1   Number of Visits 16   Date for PT Re-Evaluation 09/07/17   PT Start Time 0100   PT Stop Time 0150   PT Time Calculation (min) 50 min      Past Medical History:  Diagnosis Date  . Arthritis    shoulders and hands  . Diabetes mellitus without complication ([place])   . Enlarged prostate   . GERD (gastroesophageal reflux disease)   . Hyperlipidemia   . Hypertension   . Trigger thumb of left hand     Past Surgical History:  Procedure Laterality Date  . CARPAL TUNNEL RELEASE    . COLONOSCOPY  03/24/2012   Procedure: COLONOSCOPY;  Surgeon: Rogene Houston, MD;  [facility];  Service: Endoscopy;  Laterality: N/A;  100  . SHOULDER ACROMIOPLASTY Left 06/30/2017   Procedure: SHOULDER ACROMIOPLASTY;  Surgeon: Earlie Server, MD;  Location: [place];  Service: Orthopedics;  Laterality: Left;  . SHOULDER ARTHROSCOPY WITH OPEN ROTATOR CUFF REPAIR AND DISTAL CLAVICLE ACROMINECTOMY Left 06/30/2017   Procedure: LEFT SHOULDER ARTHROSCOPIC DEBRIDEMENT, WITH OPEN ROTATOR CUFF REPAIR, OPEN DISTAL CLAVICLE EXCISION, ACROMIOPLASTY;  Surgeon: Earlie Server, MD;  Location: [place];  Service: Orthopedics;  Laterality: Left;  . TRIGGER FINGER RELEASE Left 06/30/2017   Procedure: RELEASE TRIGGER FINGER/A-1 PULLEY LEFT THUMB;  Surgeon: Earlie Server, MD;  Location: [place];  Service: Orthopedics;  Laterality: Left;    There were no vitals filed for this visit.       Subjective Assessment - 07/13/17 1405    Subjective The patient underwent a left shoulder open RTC  repair on 06/30/17.  He remains in his lsing at this time.  He has started the pendulum exercise.  He reports he is still  in a lot of pain and his left thumb hurts as well due to a a surgical release of a "trigger thumb."  His pain decreases with rest and pain medication.  Movement increases pain.   Patient Stated Goals get use of my left shoulder again without pain.   Pain Score 7    Pain Location Shoulder   Pain Orientation Left   Pain Descriptors / Indicators Aching;Throbbing   Pain Type Surgical pain            [facility] PT Assessment - 07/13/17 0001      Assessment   Medical Diagnosis Open left RTC repair.   Referring Provider Earlie Server MD.   Onset Date/Surgical Date --  06/30/17(surgey date).     Precautions   Precaution Comments Please see protocol.  Begin with left shoulder PROM     Restrictions   Weight Bearing Restrictions No     Balance Screen   Has the patient fallen in the past 6 months No   Has the patient had a decrease in activity level because of a fear of falling?  No   Is the patient reluctant to leave their home because of a fear of falling?  No     Home Environment   Living Environment Private residence     Prior Function   Level of  Independence Independent     Observation/Other Assessments   Observations Left shoulder incision appears to be healing well.     Posture/Postural Control   Posture/Postural Control --  Guarded.     ROM / Strength   AROM / PROM / Strength PROM     PROM   Overall PROM Comments In supine:  Left shoulder passive flexion to 74 degrees; ER= 0 degrees and IR to abdomen.     Palpation   Palpation comment Very tender to palpation with even light palpation on either side of his left shoulder incision.     Ambulation/Gait   Gait Comments WNL.            Objective measurements completed on examination: See above findings.          [facility] Adult PT Treatment/Exercise - 07/13/17 0001      Modalities    Modalities Electrical Stimulation;Vasopneumatic     Electrical Stimulation   Electrical Stimulation Location Left shoulder.   Electrical Stimulation Action IFC    Electrical Stimulation Parameters 80-150 Hz (non-motoric) x 20 minutes.   Electrical Stimulation Goals Pain                PT Education - 07/13/17 1411    Education provided Yes   Education Details HEP.   Person(s) Educated Patient   Methods Explanation;Demonstration;Tactile cues;Verbal cues   Comprehension Verbalized understanding;Returned demonstration             PT Long Term Goals - 07/13/17 1423      PT LONG TERM GOAL #1   Title Independent with a HEP.   Time 8   Period Weeks   Status New     PT LONG TERM GOAL #2   Title Active left shoulder flexion to 145 degrees so the patient can easily reach overhead.   Time 8   Period Weeks   Status New     PT LONG TERM GOAL #3   Title Active ER to 70 degrees+ to allow for easily donning/doffing of apparel.   Time 8   Period Weeks   Status New     PT LONG TERM GOAL #4   Title Increase ROM so patient is able to reach behind back to L3.   Time 8   Period Weeks   Status New     PT LONG TERM GOAL #5   Title Increase left shoulder strength to a solid 4+/5 to increase stability for performance of functional activities.   Time 8   Period Weeks   Status New     PT LONG TERM GOAL #6   Title Perform ADL's with pain not > 2-3/10.   Time 8   Period Weeks   Status New                Plan - 07/13/17 1416    Clinical Impression Statement The patient presents to OPPT s/p left shoulder open RTC repair surgery performed on 06/30/17.  He still has a high pain-level and significant restrictions of left shoulder PROM currently.  He remain in a sling at this time.  Pain and deficits impair his ability to perform ADL's using his left UE.  Patient will benefit from skilled physical therapy per protocol guidelines.   History and Personal Factors relevant to  plan of care: Left thumb surgery performed on same day as RTC repair.   Clinical Presentation Stable   Clinical Presentation due to: Good surgical outcome.   Clinical Decision Making  Low   Rehab Potential Excellent   PT Frequency 2x / week   PT Duration 8 weeks   PT Treatment/Interventions ADLs/Self Care Home Management;Cryotherapy;Electrical Stimulation;Ultrasound;Patient/family education;Neuromuscular re-education;Therapeutic exercise;Therapeutic activities;Manual techniques;Passive range of motion;Vasopneumatic Device   PT Next Visit Plan Begin with PROM to left shoulder (please see protocol); electrical stimulation and vasopneumatic.   Consulted and Agree with Plan of Care Patient      Patient will benefit from skilled therapeutic intervention in order to improve the following deficits and impairments:  Pain, Decreased activity tolerance, Decreased range of motion  Visit Diagnosis: Chronic left shoulder pain - Plan: PT plan of care cert/re-cert  Stiffness of left shoulder, not elsewhere classified - Plan: PT plan of care cert/re-cert      G-Codes - 36/62/94 1403    Functional Assessment Tool Used (Outpatient Only) FOTO...54% limitation.   Functional Limitation Self care   Self Care Current Status [phone]) At least 40 percent but less than 60 percent impaired, limited or restricted   Self Care Goal Status (T0354) At least 1 percent but less than 20 percent impaired, limited or restricted       Problem List Patient Active Problem List   Diagnosis Date Noted  . Hypertension 02/22/2012  . High cholesterol 02/22/2012    Irmalee Riemenschneider, [place] MPT 07/13/2017, 2:26 PM  [facility] [place], [place], [address] Phone: [phone]   Fax:  [phone]  Name: THEADORE BLUNCK MRN: [id number] Date of Birth: 11/07/1956

## 2017-07-14 ENCOUNTER — Encounter: Payer: Self-pay | Admitting: Physical Therapy

## 2017-07-14 ENCOUNTER — Ambulatory Visit: Payer: BLUE CROSS/BLUE SHIELD | Admitting: Physical Therapy

## 2017-07-14 DIAGNOSIS — M25512 Pain in left shoulder: Secondary | ICD-10-CM | POA: Diagnosis not present

## 2017-07-14 DIAGNOSIS — G8929 Other chronic pain: Secondary | ICD-10-CM

## 2017-07-14 DIAGNOSIS — M25612 Stiffness of left shoulder, not elsewhere classified: Secondary | ICD-10-CM

## 2017-07-14 NOTE — Therapy (Signed)
[facility] [place], [place], [address] Phone: [phone]   Fax:  [phone]  Physical Therapy Treatment  Patient Details  Name: [name] MRN: [id number] Date of Birth: 01-Apr-1956 Referring Provider: Earlie Server MD.  Encounter Date: 07/14/2017      PT End of Session - 07/14/17 1104    Visit Number 2   Number of Visits 16   Date for PT Re-Evaluation 09/07/17   PT Start Time 1031   PT Stop Time 1118   PT Time Calculation (min) 47 min   Activity Tolerance Patient tolerated treatment well   Behavior During Therapy [facility] for tasks assessed/performed      Past Medical History:  Diagnosis Date  . Arthritis    shoulders and hands  . Diabetes mellitus without complication ([place])   . Enlarged prostate   . GERD (gastroesophageal reflux disease)   . Hyperlipidemia   . Hypertension   . Trigger thumb of left hand     Past Surgical History:  Procedure Laterality Date  . CARPAL TUNNEL RELEASE    . COLONOSCOPY  03/24/2012   Procedure: COLONOSCOPY;  Surgeon: Rogene Houston, MD;  [facility];  Service: Endoscopy;  Laterality: N/A;  100  . SHOULDER ACROMIOPLASTY Left 06/30/2017   Procedure: SHOULDER ACROMIOPLASTY;  Surgeon: Earlie Server, MD;  Location: [place];  Service: Orthopedics;  Laterality: Left;  . SHOULDER ARTHROSCOPY WITH OPEN ROTATOR CUFF REPAIR AND DISTAL CLAVICLE ACROMINECTOMY Left 06/30/2017   Procedure: LEFT SHOULDER ARTHROSCOPIC DEBRIDEMENT, WITH OPEN ROTATOR CUFF REPAIR, OPEN DISTAL CLAVICLE EXCISION, ACROMIOPLASTY;  Surgeon: Earlie Server, MD;  Location: [place];  Service: Orthopedics;  Laterality: Left;  . TRIGGER FINGER RELEASE Left 06/30/2017   Procedure: RELEASE TRIGGER FINGER/A-1 PULLEY LEFT THUMB;  Surgeon: Earlie Server, MD;  Location: [place];  Service: Orthopedics;  Laterality: Left;    There were no vitals filed for this  visit.      Subjective Assessment - 07/14/17 1036    Subjective Patient reported doing HEP as instructed and little discomfort   Patient Stated Goals get use of my left shoulder again without pain.   Currently in Pain? Yes   Pain Score 4    Pain Location Shoulder   Pain Orientation Left   Pain Descriptors / Indicators Aching   Pain Type Surgical pain   Pain Onset 1 to 4 weeks ago   Pain Frequency Intermittent   Aggravating Factors  movement    Pain Relieving Factors rest            [facility] PT Assessment - 07/14/17 0001      ROM / Strength   AROM / PROM / Strength PROM     PROM   PROM Assessment Site Shoulder   Right/Left Shoulder Left   Left Shoulder Flexion 78 Degrees   Left Shoulder External Rotation 13 Degrees                     [facility] Adult PT Treatment/Exercise - 07/14/17 0001      Modalities   Modalities Electrical Stimulation;Cryotherapy     Cryotherapy   Number Minutes Cryotherapy 15 Minutes   Cryotherapy Location Shoulder   Type of Cryotherapy Ice pack     Electrical Stimulation   Electrical Stimulation Location Left shoulder.   Chartered certified accountant IFC   Electrical Stimulation Parameters 80-150hz  x15   Electrical Stimulation Goals Pain     Manual Therapy  Manual Therapy Passive ROM   Passive ROM manual PROM for left shoulder flexion and ER with gentle range                PT Education - 17-Jul-2017 1411    Education provided Yes   Education Details HEP.   Person(s) Educated Patient   Methods Explanation;Demonstration;Tactile cues;Verbal cues   Comprehension Verbalized understanding;Returned demonstration             PT Long Term Goals - 07/14/17 1105      PT LONG TERM GOAL #1   Title Independent with a HEP.   Time 8   Period Weeks   Status On-going     PT LONG TERM GOAL #2   Title Active left shoulder flexion to 145 degrees so the patient can easily reach overhead.   Time 8   Period Weeks   Status  On-going     PT LONG TERM GOAL #3   Title Active ER to 70 degrees+ to allow for easily donning/doffing of apparel.   Time 8   Period Weeks   Status On-going     PT LONG TERM GOAL #4   Title Increase ROM so patient is able to reach behind back to L3.   Time 8   Period Weeks   Status On-going     PT LONG TERM GOAL #5   Title Increase left shoulder strength to a solid 4+/5 to increase stability for performance of functional activities.   Time 8   Period Weeks   Status On-going     PT LONG TERM GOAL #6   Title Perform ADL's with pain not > 2-3/10.   Time 8   Period Weeks   Status On-going               Plan - 07/14/17 1106    Clinical Impression Statement Patient tolerated treatment well with PROM today. Patient reported doing HEP as instructed by PT. Patient has improved with PROM today with gentle slow range for both flexion and ER. Patient has ongoing discomfort with movement at this time yet get relief with rest and modalities. Goals ongoing due to protocol/healing.   Rehab Potential Excellent   Clinical Impairments Affecting Rehab Potential surgery 06/30/17 current 2 weeks 07/14/17   PT Frequency 2x / week   PT Duration 8 weeks   PT Treatment/Interventions ADLs/Self Care Home Management;Cryotherapy;Electrical Stimulation;Ultrasound;Patient/family education;Neuromuscular re-education;Therapeutic exercise;Therapeutic activities;Manual techniques;Passive range of motion;Vasopneumatic Device   PT Next Visit Plan cont with PROM to left shoulder (please see protocol in media tab or binder); electrical stimulation and vasopneumatic.   Consulted and Agree with Plan of Care Patient      Patient will benefit from skilled therapeutic intervention in order to improve the following deficits and impairments:  Pain, Decreased activity tolerance, Decreased range of motion  Visit Diagnosis: Chronic left shoulder pain  Stiffness of left shoulder, not elsewhere classified        G-Codes - 07-17-2017 1403    Functional Assessment Tool Used (Outpatient Only) FOTO...54% limitation.   Functional Limitation Self care   Self Care Current Status [phone]) At least 40 percent but less than 60 percent impaired, limited or restricted   Self Care Goal Status (F0263) At least 1 percent but less than 20 percent impaired, limited or restricted      Problem List Patient Active Problem List   Diagnosis Date Noted  . Hypertension 02/22/2012  . High cholesterol 02/22/2012    Mayfield Schoene P, PTA  07/14/2017, 11:21 AM  [facility] [place], [place], [address] Phone: [phone]   Fax:  [phone]  Name: ALVINO LECHUGA MRN: [id number] Date of Birth: Jun 28, 1956

## 2017-07-19 ENCOUNTER — Encounter: Payer: Self-pay | Admitting: Physical Therapy

## 2017-07-19 ENCOUNTER — Ambulatory Visit: Payer: BLUE CROSS/BLUE SHIELD | Admitting: Physical Therapy

## 2017-07-19 DIAGNOSIS — M25512 Pain in left shoulder: Secondary | ICD-10-CM | POA: Diagnosis not present

## 2017-07-19 DIAGNOSIS — G8929 Other chronic pain: Secondary | ICD-10-CM

## 2017-07-19 DIAGNOSIS — M25612 Stiffness of left shoulder, not elsewhere classified: Secondary | ICD-10-CM

## 2017-07-19 NOTE — Therapy (Signed)
[facility] [place], [place], [address] Phone: [phone]   Fax:  [phone]  Physical Therapy Treatment  Patient Details  Name: [name] MRN: [id number] Date of Birth: [birth year] Referring Provider: Earlie Server MD.  Encounter Date: 07/19/2017      PT End of Session - 07/19/17 0946    Visit Number 3   Number of Visits 16   Date for PT Re-Evaluation 09/07/17   PT Start Time 0947   PT Stop Time 1031   PT Time Calculation (min) 44 min   Activity Tolerance Patient tolerated treatment well   Behavior During Therapy [facility] for tasks assessed/performed      Past Medical History:  Diagnosis Date  . Arthritis    shoulders and hands  . Diabetes mellitus without complication ([place])   . Enlarged prostate   . GERD (gastroesophageal reflux disease)   . Hyperlipidemia   . Hypertension   . Trigger thumb of left hand     Past Surgical History:  Procedure Laterality Date  . CARPAL TUNNEL RELEASE    . COLONOSCOPY  03/24/2012   Procedure: COLONOSCOPY;  Surgeon: Rogene Houston, MD;  [facility];  Service: Endoscopy;  Laterality: N/A;  100  . SHOULDER ACROMIOPLASTY Left 06/30/2017   Procedure: SHOULDER ACROMIOPLASTY;  Surgeon: Earlie Server, MD;  Location: [place];  Service: Orthopedics;  Laterality: Left;  . SHOULDER ARTHROSCOPY WITH OPEN ROTATOR CUFF REPAIR AND DISTAL CLAVICLE ACROMINECTOMY Left 06/30/2017   Procedure: LEFT SHOULDER ARTHROSCOPIC DEBRIDEMENT, WITH OPEN ROTATOR CUFF REPAIR, OPEN DISTAL CLAVICLE EXCISION, ACROMIOPLASTY;  Surgeon: Earlie Server, MD;  Location: [place];  Service: Orthopedics;  Laterality: Left;  . TRIGGER FINGER RELEASE Left 06/30/2017   Procedure: RELEASE TRIGGER FINGER/A-1 PULLEY LEFT THUMB;  Surgeon: Earlie Server, MD;  Location: [place];  Service: Orthopedics;  Laterality: Left;    There were no vitals filed for this  visit.      Subjective Assessment - 07/19/17 0946    Subjective "Its hanging in there."   Patient Stated Goals get use of my left shoulder again without pain.   Currently in Pain? Yes   Pain Score 1    Pain Location Shoulder   Pain Orientation Left   Pain Descriptors / Indicators Aching;Sore   Pain Type Surgical pain   Pain Onset 1 to 4 weeks ago            [facility] PT Assessment - 07/19/17 0001      Assessment   Medical Diagnosis Open left RTC repair.   Onset Date/Surgical Date 06/30/17   Next MD Visit 07/27/2017     Precautions   Precaution Comments Please see protocol.  Begin with left shoulder PROM     Restrictions   Weight Bearing Restrictions No                     [facility] Adult PT Treatment/Exercise - 07/19/17 0001      Modalities   Modalities Electrical Stimulation;Vasopneumatic     Electrical Stimulation   Electrical Stimulation Location L shoulder   Electrical Stimulation Action IFC   Electrical Stimulation Parameters 1-10 hz x15 min   Electrical Stimulation Goals Pain     Vasopneumatic   Number Minutes Vasopneumatic  15 minutes   Vasopnuematic Location  Shoulder   Vasopneumatic Pressure Medium   Vasopneumatic Temperature  34     Manual Therapy   Manual Therapy Passive ROM  Passive ROM PROM of L shoulder into flexion, abduction and gentle ER with gentle holds at end range to promote ROM improvements                     PT Long Term Goals - 07/14/17 1105      PT LONG TERM GOAL #1   Title Independent with a HEP.   Time 8   Period Weeks   Status On-going     PT LONG TERM GOAL #2   Title Active left shoulder flexion to 145 degrees so the patient can easily reach overhead.   Time 8   Period Weeks   Status On-going     PT LONG TERM GOAL #3   Title Active ER to 70 degrees+ to allow for easily donning/doffing of apparel.   Time 8   Period Weeks   Status On-going     PT LONG TERM GOAL #4   Title Increase ROM so  patient is able to reach behind back to L3.   Time 8   Period Weeks   Status On-going     PT LONG TERM GOAL #5   Title Increase left shoulder strength to a solid 4+/5 to increase stability for performance of functional activities.   Time 8   Period Weeks   Status On-going     PT LONG TERM GOAL #6   Title Perform ADL's with pain not > 2-3/10.   Time 8   Period Weeks   Status On-going               Plan - 07/19/17 1023    Clinical Impression Statement Patient presented in clinic with L shoulder ache and soreness today but still compliant with sling use. Patient was encouraged to continue with HEP instructions provided prior to today and not to start any new exercises until otherwise instructed. Gentle PROM of L shoulder completed with protcol limitaitons with patient reporting "feeling it" intermittantly during PROM. Firm end feels noted with all directions of PROM of L shoulder with smooth arc of motion as well. Normal modalities response noted following removal of the modalities.   Rehab Potential Excellent   Clinical Impairments Affecting Rehab Potential surgery 06/30/17 current 2 weeks 07/14/17   PT Frequency 2x / week   PT Duration 8 weeks   PT Treatment/Interventions ADLs/Self Care Home Management;Cryotherapy;Electrical Stimulation;Ultrasound;Patient/family education;Neuromuscular re-education;Therapeutic exercise;Therapeutic activities;Manual techniques;Passive range of motion;Vasopneumatic Device   PT Next Visit Plan cont with PROM to left shoulder (please see protocol in media tab or binder); electrical stimulation and vasopneumatic. Complete MD note next visit.   Consulted and Agree with Plan of Care Patient      Patient will benefit from skilled therapeutic intervention in order to improve the following deficits and impairments:  Pain, Decreased activity tolerance, Decreased range of motion  Visit Diagnosis: Chronic left shoulder pain  Stiffness of left shoulder, not  elsewhere classified     Problem List Patient Active Problem List   Diagnosis Date Noted  . Hypertension 02/22/2012  . High cholesterol 02/22/2012    Wynelle Fanny, PTA 07/19/2017, 10:34 AM  [facility] [address] [place], [place], [address] Phone: [phone]   Fax:  [phone]  Name: [name] MRN: [id number] Date of Birth: Sep 25, 1956

## 2017-07-22 ENCOUNTER — Ambulatory Visit: Payer: BLUE CROSS/BLUE SHIELD | Admitting: Physical Therapy

## 2017-07-22 ENCOUNTER — Encounter: Payer: Self-pay | Admitting: Physical Therapy

## 2017-07-22 DIAGNOSIS — M25512 Pain in left shoulder: Principal | ICD-10-CM

## 2017-07-22 DIAGNOSIS — G8929 Other chronic pain: Secondary | ICD-10-CM

## 2017-07-22 DIAGNOSIS — M25612 Stiffness of left shoulder, not elsewhere classified: Secondary | ICD-10-CM

## 2017-07-22 NOTE — Therapy (Signed)
[facility] [place], [place], [address] Phone: [phone]   Fax:  [phone]  Physical Therapy Treatment  Patient Details  Name: [name] MRN: [id number] Date of Birth: [birth year] Referring Provider: Earlie Server MD.  Encounter Date: 07/22/2017      PT End of Session - 07/22/17 0844    Visit Number 4   Number of Visits 16   Date for PT Re-Evaluation 09/07/17   PT Start Time 0815   PT Stop Time 0901   PT Time Calculation (min) 46 min   Activity Tolerance Patient tolerated treatment well   Behavior During Therapy [facility] for tasks assessed/performed      Past Medical History:  Diagnosis Date  . Arthritis    shoulders and hands  . Diabetes mellitus without complication ([place])   . Enlarged prostate   . GERD (gastroesophageal reflux disease)   . Hyperlipidemia   . Hypertension   . Trigger thumb of left hand     Past Surgical History:  Procedure Laterality Date  . CARPAL TUNNEL RELEASE    . COLONOSCOPY  03/24/2012   Procedure: COLONOSCOPY;  Surgeon: Rogene Houston, MD;  [facility];  Service: Endoscopy;  Laterality: N/A;  100  . SHOULDER ACROMIOPLASTY Left 06/30/2017   Procedure: SHOULDER ACROMIOPLASTY;  Surgeon: Earlie Server, MD;  Location: [place];  Service: Orthopedics;  Laterality: Left;  . SHOULDER ARTHROSCOPY WITH OPEN ROTATOR CUFF REPAIR AND DISTAL CLAVICLE ACROMINECTOMY Left 06/30/2017   Procedure: LEFT SHOULDER ARTHROSCOPIC DEBRIDEMENT, WITH OPEN ROTATOR CUFF REPAIR, OPEN DISTAL CLAVICLE EXCISION, ACROMIOPLASTY;  Surgeon: Earlie Server, MD;  Location: [place];  Service: Orthopedics;  Laterality: Left;  . TRIGGER FINGER RELEASE Left 06/30/2017   Procedure: RELEASE TRIGGER FINGER/A-1 PULLEY LEFT THUMB;  Surgeon: Earlie Server, MD;  Location: [place];  Service: Orthopedics;  Laterality: Left;    There were no vitals filed for this  visit.      Subjective Assessment - 07/22/17 0820    Subjective Patient reported some increased pain yesterday for unknown reason   Patient Stated Goals get use of my left shoulder again without pain.   Currently in Pain? Yes   Pain Score 1    Pain Location Shoulder   Pain Orientation Left   Pain Descriptors / Indicators Aching;Discomfort   Pain Type Surgical pain   Pain Onset 1 to 4 weeks ago   Pain Frequency Intermittent   Aggravating Factors  movement   Pain Relieving Factors at rest            [facility] PT Assessment - 07/22/17 0001      PROM   PROM Assessment Site Shoulder   Right/Left Shoulder Left   Left Shoulder Flexion 95 Degrees   Left Shoulder External Rotation 40 Degrees                     [facility] Adult PT Treatment/Exercise - 07/22/17 0001      Electrical Stimulation   Electrical Stimulation Location L shoulder   Electrical Stimulation Action IFC   Electrical Stimulation Parameters 1-10hz  x37min   Electrical Stimulation Goals Pain     Vasopneumatic   Number Minutes Vasopneumatic  15 minutes   Vasopnuematic Location  Shoulder   Vasopneumatic Pressure Medium     Manual Therapy   Manual Therapy Passive ROM   Passive ROM PROM of L shoulder into flexion, abduction and gentle ER with gentle holds at end  range to promote ROM improvements                     PT Long Term Goals - 07/14/17 1105      PT LONG TERM GOAL #1   Title Independent with a HEP.   Time 8   Period Weeks   Status On-going     PT LONG TERM GOAL #2   Title Active left shoulder flexion to 145 degrees so the patient can easily reach overhead.   Time 8   Period Weeks   Status On-going     PT LONG TERM GOAL #3   Title Active ER to 70 degrees+ to allow for easily donning/doffing of apparel.   Time 8   Period Weeks   Status On-going     PT LONG TERM GOAL #4   Title Increase ROM so patient is able to reach behind back to L3.   Time 8   Period Weeks   Status  On-going     PT LONG TERM GOAL #5   Title Increase left shoulder strength to a solid 4+/5 to increase stability for performance of functional activities.   Time 8   Period Weeks   Status On-going     PT LONG TERM GOAL #6   Title Perform ADL's with pain not > 2-3/10.   Time 8   Period Weeks   Status On-going               Plan - 07/22/17 0845    Clinical Impression Statement Patient tolerated treatment well today. Patient has improved with PROM for left shoulder flexion and ER today. Patient has little discomfort overall yet increased at times at night when sleeping. Current goals ongoing at this time. MD appt next week.    Rehab Potential Excellent   Clinical Impairments Affecting Rehab Potential surgery 06/30/17 current 3 weeks 07/21/17   PT Frequency 2x / week   PT Duration 8 weeks   PT Treatment/Interventions ADLs/Self Care Home Management;Cryotherapy;Electrical Stimulation;Ultrasound;Patient/family education;Neuromuscular re-education;Therapeutic exercise;Therapeutic activities;Manual techniques;Passive range of motion;Vasopneumatic Device   PT Next Visit Plan cont with PROM to left shoulder (please see protocol in media tab or binder); electrical stimulation and vasopneumatic. ( MD. [place] next visit for appt 07/27/17)   Consulted and Agree with Plan of Care Patient      Patient will benefit from skilled therapeutic intervention in order to improve the following deficits and impairments:  Pain, Decreased activity tolerance, Decreased range of motion  Visit Diagnosis: Chronic left shoulder pain  Stiffness of left shoulder, not elsewhere classified     Problem List Patient Active Problem List   Diagnosis Date Noted  . Hypertension 02/22/2012  . High cholesterol 02/22/2012    Renald Haithcock P, PTA 07/22/2017, 9:03 AM  [facility] [place], [place], [address] Phone: [phone]   Fax:   [phone]  Name: [name] MRN: [id number] Date of Birth: [birth year]

## 2017-07-26 ENCOUNTER — Ambulatory Visit: Payer: BLUE CROSS/BLUE SHIELD | Admitting: Physical Therapy

## 2017-07-26 DIAGNOSIS — M25512 Pain in left shoulder: Principal | ICD-10-CM

## 2017-07-26 DIAGNOSIS — M25612 Stiffness of left shoulder, not elsewhere classified: Secondary | ICD-10-CM

## 2017-07-26 DIAGNOSIS — G8929 Other chronic pain: Secondary | ICD-10-CM

## 2017-07-26 NOTE — Therapy (Signed)
[facility] [place], [place], [address] Phone: [phone]   Fax:  [phone]  Physical Therapy Treatment  Patient Details  Name: [name] MRN: [id number] Date of Birth: December 06, 1955 Referring Provider: Earlie Server MD.  Encounter Date: 07/26/2017      PT End of Session - 07/26/17 1234    Visit Number 5   Number of Visits 16   Date for PT Re-Evaluation 09/07/17   PT Start Time 0946   PT Stop Time 1037   PT Time Calculation (min) 51 min   Activity Tolerance Patient tolerated treatment well   Behavior During Therapy [facility] for tasks assessed/performed      Past Medical History:  Diagnosis Date  . Arthritis    shoulders and hands  . Diabetes mellitus without complication ([place])   . Enlarged prostate   . GERD (gastroesophageal reflux disease)   . Hyperlipidemia   . Hypertension   . Trigger thumb of left hand     Past Surgical History:  Procedure Laterality Date  . CARPAL TUNNEL RELEASE    . COLONOSCOPY  03/24/2012   Procedure: COLONOSCOPY;  Surgeon: Rogene Houston, MD;  [facility];  Service: Endoscopy;  Laterality: N/A;  100  . SHOULDER ACROMIOPLASTY Left 06/30/2017   Procedure: SHOULDER ACROMIOPLASTY;  Surgeon: Earlie Server, MD;  Location: [place];  Service: Orthopedics;  Laterality: Left;  . SHOULDER ARTHROSCOPY WITH OPEN ROTATOR CUFF REPAIR AND DISTAL CLAVICLE ACROMINECTOMY Left 06/30/2017   Procedure: LEFT SHOULDER ARTHROSCOPIC DEBRIDEMENT, WITH OPEN ROTATOR CUFF REPAIR, OPEN DISTAL CLAVICLE EXCISION, ACROMIOPLASTY;  Surgeon: Earlie Server, MD;  Location: [place];  Service: Orthopedics;  Laterality: Left;  . TRIGGER FINGER RELEASE Left 06/30/2017   Procedure: RELEASE TRIGGER FINGER/A-1 PULLEY LEFT THUMB;  Surgeon: Earlie Server, MD;  Location: [place];  Service: Orthopedics;  Laterality: Left;    There were no vitals filed for this  visit.      Subjective Assessment - 07/26/17 1235    Subjective Doing good today.   Patient Stated Goals get use of my left shoulder again without pain.   Currently in Pain? Yes   Pain Score 1    Pain Location Shoulder   Pain Orientation Left   Pain Descriptors / Indicators Aching;Discomfort   Pain Type Surgical pain   Pain Onset 1 to 4 weeks ago   Multiple Pain Sites No                         [facility] Adult PT Treatment/Exercise - 07/26/17 0001      Electrical Stimulation   Electrical Stimulation Location --  Left shoulder.   Electrical Stimulation Action IFC   Electrical Stimulation Parameters 80-150 Hz x 15 minutes.   Electrical Stimulation Goals Pain     Vasopneumatic   Number Minutes Vasopneumatic  15 minutes   Vasopnuematic Location  --  Lt shoulder folded pillow between lt elbow and thorax.         Vasopneumatic Pressure Low     Manual Therapy   Manual Therapy Passive ROM   Passive ROM In supine:  PROM x 24 minutes to patient left shoulder with emphasis on flexion and ER.                       PT Long Term Goals - 07/14/17 1105      PT LONG TERM GOAL #1  Title Independent with a HEP.   Time 8   Period Weeks   Status On-going     PT LONG TERM GOAL #2   Title Active left shoulder flexion to 145 degrees so the patient can easily reach overhead.   Time 8   Period Weeks   Status On-going     PT LONG TERM GOAL #3   Title Active ER to 70 degrees+ to allow for easily donning/doffing of apparel.   Time 8   Period Weeks   Status On-going     PT LONG TERM GOAL #4   Title Increase ROM so patient is able to reach behind back to L3.   Time 8   Period Weeks   Status On-going     PT LONG TERM GOAL #5   Title Increase left shoulder strength to a solid 4+/5 to increase stability for performance of functional activities.   Time 8   Period Weeks   Status On-going     PT LONG TERM GOAL #6   Title Perform ADL's with pain not >  2-3/10.   Time 8   Period Weeks   Status On-going               Plan - 07/26/17 1234    Clinical Impression Statement Per protocol.  Excellent improvement with regards to left shoulder PROM.      Patient will benefit from skilled therapeutic intervention in order to improve the following deficits and impairments:     Visit Diagnosis: Chronic left shoulder pain  Stiffness of left shoulder, not elsewhere classified     Problem List Patient Active Problem List   Diagnosis Date Noted  . Hypertension 02/22/2012  . High cholesterol 02/22/2012    [place], [place] MPT 07/26/2017, 12:39 PM  [facility] [place], [place], [address] Phone: [phone]   Fax:  [phone]  Name: [name] MRN: [id number] Date of Birth: [birth year]

## 2017-07-29 ENCOUNTER — Encounter: Payer: Self-pay | Admitting: Physical Therapy

## 2017-07-29 ENCOUNTER — Ambulatory Visit: Payer: BLUE CROSS/BLUE SHIELD | Admitting: Physical Therapy

## 2017-07-29 DIAGNOSIS — M25612 Stiffness of left shoulder, not elsewhere classified: Secondary | ICD-10-CM

## 2017-07-29 DIAGNOSIS — M25512 Pain in left shoulder: Secondary | ICD-10-CM | POA: Diagnosis not present

## 2017-07-29 DIAGNOSIS — G8929 Other chronic pain: Secondary | ICD-10-CM

## 2017-07-29 NOTE — Therapy (Signed)
[facility] [place], [place], [address] Phone: [phone]   Fax:  [phone]  Physical Therapy Treatment  Patient Details  Name: [name] MRN: [id number] Date of Birth: 1956-10-11 Referring Provider: Earlie Server MD.  Encounter Date: 07/29/2017      PT End of Session - 07/29/17 1333    Visit Number 6   Number of Visits 16   Date for PT Re-Evaluation 09/07/17   PT Start Time 1300   PT Stop Time 1349   PT Time Calculation (min) 49 min   Activity Tolerance Patient tolerated treatment well   Behavior During Therapy [facility] for tasks assessed/performed      Past Medical History:  Diagnosis Date  . Arthritis    shoulders and hands  . Diabetes mellitus without complication ([place])   . Enlarged prostate   . GERD (gastroesophageal reflux disease)   . Hyperlipidemia   . Hypertension   . Trigger thumb of left hand     Past Surgical History:  Procedure Laterality Date  . CARPAL TUNNEL RELEASE    . COLONOSCOPY  03/24/2012   Procedure: COLONOSCOPY;  Surgeon: Rogene Houston, MD;  [facility];  Service: Endoscopy;  Laterality: N/A;  100  . SHOULDER ACROMIOPLASTY Left 06/30/2017   Procedure: SHOULDER ACROMIOPLASTY;  Surgeon: Earlie Server, MD;  Location: [place];  Service: Orthopedics;  Laterality: Left;  . SHOULDER ARTHROSCOPY WITH OPEN ROTATOR CUFF REPAIR AND DISTAL CLAVICLE ACROMINECTOMY Left 06/30/2017   Procedure: LEFT SHOULDER ARTHROSCOPIC DEBRIDEMENT, WITH OPEN ROTATOR CUFF REPAIR, OPEN DISTAL CLAVICLE EXCISION, ACROMIOPLASTY;  Surgeon: Earlie Server, MD;  Location: [place];  Service: Orthopedics;  Laterality: Left;  . TRIGGER FINGER RELEASE Left 06/30/2017   Procedure: RELEASE TRIGGER FINGER/A-1 PULLEY LEFT THUMB;  Surgeon: Earlie Server, MD;  Location: [place];  Service: Orthopedics;  Laterality: Left;    There were no vitals filed for this  visit.      Subjective Assessment - 07/29/17 1306    Subjective Patient has been progressing well witout brace   Patient Stated Goals get use of my left shoulder again without pain.   Currently in Pain? Yes   Pain Score 1    Pain Location Shoulder   Pain Orientation Left   Pain Descriptors / Indicators Aching;Discomfort   Pain Type Surgical pain   Pain Onset 1 to 4 weeks ago   Pain Frequency Intermittent   Aggravating Factors  movement   Pain Relieving Factors at rest            [facility] PT Assessment - 07/29/17 0001      PROM   PROM Assessment Site Shoulder   Right/Left Shoulder Left   Left Shoulder Flexion 118 Degrees   Left Shoulder External Rotation 60 Degrees                     [facility] Adult PT Treatment/Exercise - 07/29/17 0001      Exercises   Exercises Shoulder     Shoulder Exercises: Supine   Other Supine Exercises Demo for supine passive cane for ER     Electrical Stimulation   Electrical Stimulation Location L shoulder   Electrical Stimulation Action IFC   Electrical Stimulation Parameters 80-150hz  x9min   Electrical Stimulation Goals Pain     Vasopneumatic   Number Minutes Vasopneumatic  15 minutes   Vasopnuematic Location  Shoulder   Vasopneumatic Pressure Low     Manual  Therapy   Manual Therapy Passive ROM   Passive ROM PROM for left shoulder flexion ER with gentle range                     PT Long Term Goals - 07/14/17 1105      PT LONG TERM GOAL #1   Title Independent with a HEP.   Time 8   Period Weeks   Status On-going     PT LONG TERM GOAL #2   Title Active left shoulder flexion to 145 degrees so the patient can easily reach overhead.   Time 8   Period Weeks   Status On-going     PT LONG TERM GOAL #3   Title Active ER to 70 degrees+ to allow for easily donning/doffing of apparel.   Time 8   Period Weeks   Status On-going     PT LONG TERM GOAL #4   Title Increase ROM so patient is able to reach  behind back to L3.   Time 8   Period Weeks   Status On-going     PT LONG TERM GOAL #5   Title Increase left shoulder strength to a solid 4+/5 to increase stability for performance of functional activities.   Time 8   Period Weeks   Status On-going     PT LONG TERM GOAL #6   Title Perform ADL's with pain not > 2-3/10.   Time 8   Period Weeks   Status On-going               Plan - 07/29/17 1335    Clinical Impression Statement Patient progressing well today with little discomfort. Patient has improved with PROM for both flexion and ER today. Educated patient on supine passive ER with cane today. Patient reported doing well without sling per MD and progressing well overall. Goals ongoing.    Rehab Potential Excellent   Clinical Impairments Affecting Rehab Potential surgery 06/30/17 current 4 weeks 07/28/17   PT Frequency 2x / week   PT Duration 8 weeks   PT Treatment/Interventions ADLs/Self Care Home Management;Cryotherapy;Electrical Stimulation;Ultrasound;Patient/family education;Neuromuscular re-education;Therapeutic exercise;Therapeutic activities;Manual techniques;Passive range of motion;Vasopneumatic Device   PT Next Visit Plan cont with PROM to left shoulder (please see protocol in media tab or binder); electrical stimulation and vasopneumatic   Consulted and Agree with Plan of Care Patient      Patient will benefit from skilled therapeutic intervention in order to improve the following deficits and impairments:  Pain, Decreased activity tolerance, Decreased range of motion  Visit Diagnosis: Chronic left shoulder pain  Stiffness of left shoulder, not elsewhere classified     Problem List Patient Active Problem List   Diagnosis Date Noted  . Hypertension 02/22/2012  . High cholesterol 02/22/2012    Rusell Meneely P, PTA 07/29/2017, 1:53 PM  [facility] [place], [place], [address] Phone:  [phone]   Fax:  [phone]  Name: [name] MRN: [id number] Date of Birth: 1956-10-28

## 2017-08-02 ENCOUNTER — Ambulatory Visit: Payer: BLUE CROSS/BLUE SHIELD | Admitting: Physical Therapy

## 2017-08-02 DIAGNOSIS — G8929 Other chronic pain: Secondary | ICD-10-CM

## 2017-08-02 DIAGNOSIS — M25612 Stiffness of left shoulder, not elsewhere classified: Secondary | ICD-10-CM

## 2017-08-02 DIAGNOSIS — M25512 Pain in left shoulder: Secondary | ICD-10-CM

## 2017-08-02 NOTE — Therapy (Signed)
[facility] [place], [place], [address] Phone: [phone]   Fax:  [phone]  Physical Therapy Treatment  Patient Details  Name: [name] MRN: [id number] Date of Birth: 09/09/1956 Referring Provider: Earlie Server MD.  Encounter Date: 08/02/2017      PT End of Session - 08/02/17 0944    Visit Number 7   Number of Visits 16   Date for PT Re-Evaluation 09/07/17   PT Start Time 0906   PT Stop Time 0958   PT Time Calculation (min) 52 min   Activity Tolerance Patient tolerated treatment well   Behavior During Therapy [facility] for tasks assessed/performed      Past Medical History:  Diagnosis Date  . Arthritis    shoulders and hands  . Diabetes mellitus without complication ([place])   . Enlarged prostate   . GERD (gastroesophageal reflux disease)   . Hyperlipidemia   . Hypertension   . Trigger thumb of left hand     Past Surgical History:  Procedure Laterality Date  . CARPAL TUNNEL RELEASE    . COLONOSCOPY  03/24/2012   Procedure: COLONOSCOPY;  Surgeon: Rogene Houston, MD;  [facility];  Service: Endoscopy;  Laterality: N/A;  100  . SHOULDER ACROMIOPLASTY Left 06/30/2017   Procedure: SHOULDER ACROMIOPLASTY;  Surgeon: Earlie Server, MD;  Location: [place];  Service: Orthopedics;  Laterality: Left;  . SHOULDER ARTHROSCOPY WITH OPEN ROTATOR CUFF REPAIR AND DISTAL CLAVICLE ACROMINECTOMY Left 06/30/2017   Procedure: LEFT SHOULDER ARTHROSCOPIC DEBRIDEMENT, WITH OPEN ROTATOR CUFF REPAIR, OPEN DISTAL CLAVICLE EXCISION, ACROMIOPLASTY;  Surgeon: Earlie Server, MD;  Location: [place];  Service: Orthopedics;  Laterality: Left;  . TRIGGER FINGER RELEASE Left 06/30/2017   Procedure: RELEASE TRIGGER FINGER/A-1 PULLEY LEFT THUMB;  Surgeon: Earlie Server, MD;  Location: [place];  Service: Orthopedics;  Laterality: Left;    There were no vitals filed for this  visit.      Subjective Assessment - 08/02/17 0910    Subjective Patient reports he pulled a muscle (L low traps area) 2 days previous which has been really hurting him.   Patient Stated Goals get use of my left shoulder again without pain.   Currently in Pain? Yes   Pain Score 1    Pain Location Shoulder                         [facility] Adult PT Treatment/Exercise - 08/02/17 0001      Shoulder Exercises: Supine   Protraction Strengthening;Left;20 reps   External Rotation AAROM;Left;20 reps  with cane elbow at 90 deg     Shoulder Exercises: Isometric Strengthening   Flexion 5X10"   Extension 5X10"   External Rotation 5X10"   Internal Rotation 5X10"   ABduction 5X10"     Modalities   Modalities Electrical Stimulation;Vasopneumatic     Electrical Stimulation   Electrical Stimulation Location L shoulder   Electrical Stimulation Action IFC   Electrical Stimulation Parameters 80-150 Hz x 15 min   Electrical Stimulation Goals Pain     Vasopneumatic   Number Minutes Vasopneumatic  15 minutes   Vasopnuematic Location  Shoulder   Vasopneumatic Pressure Low   Vasopneumatic Temperature  34     Manual Therapy   Manual Therapy Passive ROM   Passive ROM to left shoulder into flex and ER  PT Education - 08/02/17 1526    Education provided Yes   Education Details HEP   Person(s) Educated Patient   Methods Explanation;Demonstration;Handout;Verbal cues;Tactile cues   Comprehension Verbalized understanding;Returned demonstration             PT Long Term Goals - 07/14/17 1105      PT LONG TERM GOAL #1   Title Independent with a HEP.   Time 8   Period Weeks   Status On-going     PT LONG TERM GOAL #2   Title Active left shoulder flexion to 145 degrees so the patient can easily reach overhead.   Time 8   Period Weeks   Status On-going     PT LONG TERM GOAL #3   Title Active ER to 70 degrees+ to allow for easily donning/doffing of  apparel.   Time 8   Period Weeks   Status On-going     PT LONG TERM GOAL #4   Title Increase ROM so patient is able to reach behind back to L3.   Time 8   Period Weeks   Status On-going     PT LONG TERM GOAL #5   Title Increase left shoulder strength to a solid 4+/5 to increase stability for performance of functional activities.   Time 8   Period Weeks   Status On-going     PT LONG TERM GOAL #6   Title Perform ADL's with pain not > 2-3/10.   Time 8   Period Weeks   Status On-going               Plan - 08/02/17 1526    Clinical Impression Statement Patient presents today with c/o spasm in L lower traps. Heat was applied to the area during manual therapy for L shoulder. Patient tolerated new TE well without c/o pain. He needed VCs for correct form with ER cane.    PT Treatment/Interventions ADLs/Self Care Home Management;Cryotherapy;Electrical Stimulation;Ultrasound;Patient/family education;Neuromuscular re-education;Therapeutic exercise;Therapeutic activities;Manual techniques;Passive range of motion;Vasopneumatic Device   PT Next Visit Plan Follow protocol in binder/media to progress strengthening; cont with PROM to left shoulder; electrical stimulation and vasopneumatic   PT Home Exercise Plan isometrics; supine cane for flex/ER      Patient will benefit from skilled therapeutic intervention in order to improve the following deficits and impairments:  Pain, Decreased activity tolerance, Decreased range of motion  Visit Diagnosis: Stiffness of left shoulder, not elsewhere classified  Chronic left shoulder pain     Problem List Patient Active Problem List   Diagnosis Date Noted  . Hypertension 02/22/2012  . High cholesterol 02/22/2012    Madelyn Flavors PT 08/02/2017, 3:46 PM  [facility] [place], [place], [address] Phone: [phone]   Fax:  [phone]  Name: [name] MRN: [id number] Date  of Birth: 16-May-1956

## 2017-08-02 NOTE — Patient Instructions (Signed)
Strengthening: Isometric Flexion  Using wall for resistance, press right fist into ball using light pressure. Hold __10__ seconds. Repeat __5_ times per set. Do ____ sets per session. Do _2___ sessions per day.  SHOULDER: Abduction (Isometric)  Use wall as resistance. Press arm against pillow. Keep elbow straight. Hold _10_ seconds. 5___ reps per set, 2___ sets per day, ___ days per week  Extension (Isometric)  Place left bent elbow and back of arm against wall. Press elbow against wall. Hold __10__ seconds. Repeat _5___ times. Do __2__ sessions per day.  Internal Rotation (Isometric)  Place palm of right fist against door frame, with elbow bent. Press fist against door frame. Hold _10___ seconds. Repeat __5__ times. Do __2__ sessions per day.  External Rotation (Isometric)  Place back of left fist against door frame, with elbow bent. Press fist against door frame. Hold __10__ seconds. Repeat _5___ times. Do ___2_ sessions per day.  SHOULDER: External Rotation - Supine (Cane)   Hold cane with both hands. Rotate arm away from body. Keep elbow on floor and next to body. __10_ reps per set, _3__ sets per day, _2-3__ days per week Add towel to keep elbow at side.  Cane Exercise: Flexion   Lie on back, holding cane above chest. Keeping arms as straight as possible, lower cane toward floor beyond head. Take cane up to pain, but do not push into pain. Repeat _10-30___ times. Do __2-3__ sessions per day.  http://gt2.exer.us/91   Copyright  VHI. All rights reserved.    Madelyn Flavors, PT 08/02/17 9:30 AM [facility] [place], [place], [address] Phone: [phone]   Fax:  [phone]

## 2017-08-05 ENCOUNTER — Encounter: Payer: Self-pay | Admitting: Physical Therapy

## 2017-08-05 ENCOUNTER — Ambulatory Visit: Payer: BLUE CROSS/BLUE SHIELD | Admitting: Physical Therapy

## 2017-08-05 DIAGNOSIS — M25512 Pain in left shoulder: Secondary | ICD-10-CM | POA: Diagnosis not present

## 2017-08-05 DIAGNOSIS — M25612 Stiffness of left shoulder, not elsewhere classified: Secondary | ICD-10-CM

## 2017-08-05 DIAGNOSIS — G8929 Other chronic pain: Secondary | ICD-10-CM

## 2017-08-05 NOTE — Therapy (Signed)
[facility] [place], [place], [address] Phone: [phone]   Fax:  [phone]  Physical Therapy Treatment  Patient Details  Name: [name] MRN: [id number] Date of Birth: 02/21/1956 Referring Provider: Earlie Server MD.  Encounter Date: 08/05/2017      PT End of Session - 08/05/17 1013    Visit Number 8   Number of Visits 16   Date for PT Re-Evaluation 09/07/17   PT Start Time 0900   PT Stop Time 0953   PT Time Calculation (min) 53 min      Past Medical History:  Diagnosis Date  . Arthritis    shoulders and hands  . Diabetes mellitus without complication ([place])   . Enlarged prostate   . GERD (gastroesophageal reflux disease)   . Hyperlipidemia   . Hypertension   . Trigger thumb of left hand     Past Surgical History:  Procedure Laterality Date  . CARPAL TUNNEL RELEASE    . COLONOSCOPY  03/24/2012   Procedure: COLONOSCOPY;  Surgeon: Rogene Houston, MD;  [facility];  Service: Endoscopy;  Laterality: N/A;  100  . SHOULDER ACROMIOPLASTY Left 06/30/2017   Procedure: SHOULDER ACROMIOPLASTY;  Surgeon: Earlie Server, MD;  Location: [place];  Service: Orthopedics;  Laterality: Left;  . SHOULDER ARTHROSCOPY WITH OPEN ROTATOR CUFF REPAIR AND DISTAL CLAVICLE ACROMINECTOMY Left 06/30/2017   Procedure: LEFT SHOULDER ARTHROSCOPIC DEBRIDEMENT, WITH OPEN ROTATOR CUFF REPAIR, OPEN DISTAL CLAVICLE EXCISION, ACROMIOPLASTY;  Surgeon: Earlie Server, MD;  Location: [place];  Service: Orthopedics;  Laterality: Left;  . TRIGGER FINGER RELEASE Left 06/30/2017   Procedure: RELEASE TRIGGER FINGER/A-1 PULLEY LEFT THUMB;  Surgeon: Earlie Server, MD;  Location: [place];  Service: Orthopedics;  Laterality: Left;    There were no vitals filed for this visit.      Subjective Assessment - 08/05/17 1017    Subjective I'm doing better today.   Patient Stated Goals  get use of my left shoulder again without pain.   Pain Score 1    Pain Location Shoulder   Pain Orientation Left   Pain Descriptors / Indicators Discomfort   Pain Type Surgical pain   Pain Onset More than a month ago   Pain Frequency Intermittent                         [facility] Adult PT Treatment/Exercise - 08/05/17 0001      Electrical Stimulation   Electrical Stimulation Location Left shoulder.   Electrical Stimulation Action IFC   Electrical Stimulation Parameters 80-150 Hz x 20 minutes.   Electrical Stimulation Goals Pain     Vasopneumatic   Number Minutes Vasopneumatic  20 minutes   Vasopnuematic Location  --  Left shoulder.     Manual Therapy   Manual Therapy Passive ROM   Passive ROM In supine:  PROM into left shoulder flexion, IR and ER x 23 minutes including rhy stabs at 90 degrees.                     PT Long Term Goals - 07/14/17 1105      PT LONG TERM GOAL #1   Title Independent with a HEP.   Time 8   Period Weeks   Status On-going     PT LONG TERM GOAL #2   Title Active left shoulder flexion to 145 degrees so the patient can easily  reach overhead.   Time 8   Period Weeks   Status On-going     PT LONG TERM GOAL #3   Title Active ER to 70 degrees+ to allow for easily donning/doffing of apparel.   Time 8   Period Weeks   Status On-going     PT LONG TERM GOAL #4   Title Increase ROM so patient is able to reach behind back to L3.   Time 8   Period Weeks   Status On-going     PT LONG TERM GOAL #5   Title Increase left shoulder strength to a solid 4+/5 to increase stability for performance of functional activities.   Time 8   Period Weeks   Status On-going     PT LONG TERM GOAL #6   Title Perform ADL's with pain not > 2-3/10.   Time 8   Period Weeks   Status On-going               Plan - 08/05/17 1024    Clinical Impression Statement Excellent improvement.  Patient exhibited full passive left shoulder ER  today.   PT Treatment/Interventions ADLs/Self Care Home Management;Cryotherapy;Electrical Stimulation;Ultrasound;Patient/family education;Neuromuscular re-education;Therapeutic exercise;Therapeutic activities;Manual techniques;Passive range of motion;Vasopneumatic Device   PT Next Visit Plan Follow protocol in binder/media to progress strengthening; cont with PROM to left shoulder; electrical stimulation and vasopneumatic   PT Home Exercise Plan isometrics; supine cane for flex/ER   Consulted and Agree with Plan of Care Patient      Patient will benefit from skilled therapeutic intervention in order to improve the following deficits and impairments:  Pain, Decreased activity tolerance, Decreased range of motion  Visit Diagnosis: Stiffness of left shoulder, not elsewhere classified  Chronic left shoulder pain     Problem List Patient Active Problem List   Diagnosis Date Noted  . Hypertension 02/22/2012  . High cholesterol 02/22/2012    Laquiesha Piacente, [place] MPT 08/05/2017, 10:26 AM  [facility] [place], [place], [address] Phone: [phone]   Fax:  [phone]  Name: [name] MRN: [id number] Date of Birth: 02/01/1956

## 2017-08-09 ENCOUNTER — Ambulatory Visit: Payer: BLUE CROSS/BLUE SHIELD | Attending: Orthopedic Surgery | Admitting: Physical Therapy

## 2017-08-09 ENCOUNTER — Encounter: Payer: Self-pay | Admitting: Physical Therapy

## 2017-08-09 DIAGNOSIS — M25512 Pain in left shoulder: Secondary | ICD-10-CM | POA: Diagnosis present

## 2017-08-09 DIAGNOSIS — G8929 Other chronic pain: Secondary | ICD-10-CM

## 2017-08-09 DIAGNOSIS — M6281 Muscle weakness (generalized): Secondary | ICD-10-CM | POA: Diagnosis present

## 2017-08-09 DIAGNOSIS — M25612 Stiffness of left shoulder, not elsewhere classified: Secondary | ICD-10-CM

## 2017-08-09 NOTE — Therapy (Signed)
[facility] [place], [place], [address] Phone: [phone]   Fax:  [phone]  Physical Therapy Treatment  Patient Details  Name: [name] MRN: [id number] Date of Birth: 12-29-1955 Referring Provider: Earlie Server MD.  Encounter Date: 08/09/2017      PT End of Session - 08/09/17 1018    Visit Number 9   Number of Visits 16   Date for PT Re-Evaluation 09/07/17   PT Start Time 0900   PT Stop Time 0954   PT Time Calculation (min) 54 min   Activity Tolerance Patient tolerated treatment well   Behavior During Therapy [facility] for tasks assessed/performed      Past Medical History:  Diagnosis Date  . Arthritis    shoulders and hands  . Diabetes mellitus without complication ([place])   . Enlarged prostate   . GERD (gastroesophageal reflux disease)   . Hyperlipidemia   . Hypertension   . Trigger thumb of left hand     Past Surgical History:  Procedure Laterality Date  . CARPAL TUNNEL RELEASE    . COLONOSCOPY  03/24/2012   Procedure: COLONOSCOPY;  Surgeon: Rogene Houston, MD;  [facility];  Service: Endoscopy;  Laterality: N/A;  100  . SHOULDER ACROMIOPLASTY Left 06/30/2017   Procedure: SHOULDER ACROMIOPLASTY;  Surgeon: Earlie Server, MD;  Location: [place];  Service: Orthopedics;  Laterality: Left;  . SHOULDER ARTHROSCOPY WITH OPEN ROTATOR CUFF REPAIR AND DISTAL CLAVICLE ACROMINECTOMY Left 06/30/2017   Procedure: LEFT SHOULDER ARTHROSCOPIC DEBRIDEMENT, WITH OPEN ROTATOR CUFF REPAIR, OPEN DISTAL CLAVICLE EXCISION, ACROMIOPLASTY;  Surgeon: Earlie Server, MD;  Location: [place];  Service: Orthopedics;  Laterality: Left;  . TRIGGER FINGER RELEASE Left 06/30/2017   Procedure: RELEASE TRIGGER FINGER/A-1 PULLEY LEFT THUMB;  Surgeon: Earlie Server, MD;  Location: [place];  Service: Orthopedics;  Laterality: Left;    There were no vitals filed for this  visit.      Subjective Assessment - 08/09/17 1033    Subjective I want to be able to mow my lawn soon.   Patient Stated Goals get use of my left shoulder again without pain.   Currently in Pain? Yes   Pain Score 1    Pain Location Shoulder   Pain Orientation Left   Pain Descriptors / Indicators Discomfort   Pain Type Surgical pain   Pain Onset More than a month ago                         [facility] Adult PT Treatment/Exercise - 08/09/17 0001      Electrical Stimulation   Electrical Stimulation Location --  Left shoulder.   Electrical Stimulation Action IFC   Electrical Stimulation Parameters 80-150 Hz x 15 minutes (non-motoric).   Electrical Stimulation Goals Pain     Vasopneumatic   Number Minutes Vasopneumatic  15 minutes   Vasopnuematic Location  --  Left shoulder.   Vasopneumatic Pressure Low     Manual Therapy   Manual Therapy Passive ROM   Passive ROM In supine:  PROM to patient's left shoulder into flexion, ER and IR x 23 minutes.                     PT Long Term Goals - 07/14/17 1105      PT LONG TERM GOAL #1   Title Independent with a HEP.   Time 8   Period Weeks  Status On-going     PT LONG TERM GOAL #2   Title Active left shoulder flexion to 145 degrees so the patient can easily reach overhead.   Time 8   Period Weeks   Status On-going     PT LONG TERM GOAL #3   Title Active ER to 70 degrees+ to allow for easily donning/doffing of apparel.   Time 8   Period Weeks   Status On-going     PT LONG TERM GOAL #4   Title Increase ROM so patient is able to reach behind back to L3.   Time 8   Period Weeks   Status On-going     PT LONG TERM GOAL #5   Title Increase left shoulder strength to a solid 4+/5 to increase stability for performance of functional activities.   Time 8   Period Weeks   Status On-going     PT LONG TERM GOAL #6   Title Perform ADL's with pain not > 2-3/10.   Time 8   Period Weeks   Status On-going                Plan - 08/09/17 1036    Clinical Impression Statement Excellent improvement per protocol with regards to regaining near full left shouler PROM.   PT Next Visit Plan Okay to begin AAROM to left shoulder.   Consulted and Agree with Plan of Care Patient      Patient will benefit from skilled therapeutic intervention in order to improve the following deficits and impairments:     Visit Diagnosis: Stiffness of left shoulder, not elsewhere classified  Chronic left shoulder pain     Problem List Patient Active Problem List   Diagnosis Date Noted  . Hypertension 02/22/2012  . High cholesterol 02/22/2012    Froylan Hobby, [place] MPT 08/09/2017, 10:50 AM  [facility] [address] [place], [place], [address] Phone: [phone]   Fax:  [phone]  Name: [name] MRN: [id number] Date of Birth: 1956-03-15

## 2017-08-12 ENCOUNTER — Ambulatory Visit: Payer: BLUE CROSS/BLUE SHIELD | Admitting: Physical Therapy

## 2017-08-12 ENCOUNTER — Encounter: Payer: Self-pay | Admitting: Physical Therapy

## 2017-08-12 DIAGNOSIS — M25612 Stiffness of left shoulder, not elsewhere classified: Secondary | ICD-10-CM

## 2017-08-12 DIAGNOSIS — G8929 Other chronic pain: Secondary | ICD-10-CM

## 2017-08-12 DIAGNOSIS — M25512 Pain in left shoulder: Secondary | ICD-10-CM

## 2017-08-12 NOTE — Therapy (Signed)
[facility] [place], [place], [address] Phone: [phone]   Fax:  [phone]  Physical Therapy Treatment  Patient Details  Name: [name] MRN: [id number] Date of Birth: 12/07/1955 Referring Provider: Earlie Server MD.  Encounter Date: 08/12/2017      PT End of Session - 08/12/17 0946    Visit Number 10   Number of Visits 16   Date for PT Re-Evaluation 09/07/17   PT Start Time 0946   PT Stop Time 1031   PT Time Calculation (min) 45 min   Activity Tolerance Patient tolerated treatment well   Behavior During Therapy [facility] for tasks assessed/performed      Past Medical History:  Diagnosis Date  . Arthritis    shoulders and hands  . Diabetes mellitus without complication ([place])   . Enlarged prostate   . GERD (gastroesophageal reflux disease)   . Hyperlipidemia   . Hypertension   . Trigger thumb of left hand     Past Surgical History:  Procedure Laterality Date  . CARPAL TUNNEL RELEASE    . COLONOSCOPY  03/24/2012   Procedure: COLONOSCOPY;  Surgeon: Rogene Houston, MD;  [facility];  Service: Endoscopy;  Laterality: N/A;  100  . SHOULDER ACROMIOPLASTY Left 06/30/2017   Procedure: SHOULDER ACROMIOPLASTY;  Surgeon: Earlie Server, MD;  Location: [place];  Service: Orthopedics;  Laterality: Left;  . SHOULDER ARTHROSCOPY WITH OPEN ROTATOR CUFF REPAIR AND DISTAL CLAVICLE ACROMINECTOMY Left 06/30/2017   Procedure: LEFT SHOULDER ARTHROSCOPIC DEBRIDEMENT, WITH OPEN ROTATOR CUFF REPAIR, OPEN DISTAL CLAVICLE EXCISION, ACROMIOPLASTY;  Surgeon: Earlie Server, MD;  Location: [place];  Service: Orthopedics;  Laterality: Left;  . TRIGGER FINGER RELEASE Left 06/30/2017   Procedure: RELEASE TRIGGER FINGER/A-1 PULLEY LEFT THUMB;  Surgeon: Earlie Server, MD;  Location: [place];  Service: Orthopedics;  Laterality: Left;    There were no vitals filed for this  visit.      Subjective Assessment - 08/12/17 0946    Subjective Reports weedeating but used RUE to hold and LUE was guiding.   Patient Stated Goals get use of my left shoulder again without pain.   Currently in Pain? Yes   Pain Score 2    Pain Location Shoulder   Pain Orientation Left   Pain Descriptors / Indicators Discomfort   Pain Type Surgical pain   Pain Onset More than a month ago            [facility] PT Assessment - 08/12/17 0001      Assessment   Medical Diagnosis Open left RTC repair.   Onset Date/Surgical Date 06/30/17   Next MD Visit 08/24/2017     Precautions   Precaution Comments Please see protocol.  Begin with left shoulder PROM     Restrictions   Weight Bearing Restrictions No                     [facility] Adult PT Treatment/Exercise - 08/12/17 0001      Shoulder Exercises: ROM/Strengthening   Rhythmic Stabilization, Supine into ER/ flexion to promte proximal shoulder stabiltiy     Modalities   Modalities Electrical Stimulation;Vasopneumatic     Electrical Stimulation   Electrical Stimulation Location Left shoulder.   Electrical Stimulation Action IFC   Electrical Stimulation Parameters 1-10 hz x15 min   Electrical Stimulation Goals Pain     Vasopneumatic   Number Minutes Vasopneumatic  15 minutes   Vasopnuematic  Location  Shoulder   Vasopneumatic Pressure Medium   Vasopneumatic Temperature  34     Manual Therapy   Manual Therapy Passive ROM   Passive ROM PROM of L shoulder into flexion/ER/IR with holds at end range                     PT Long Term Goals - 07/14/17 1105      PT LONG TERM GOAL #1   Title Independent with a HEP.   Time 8   Period Weeks   Status On-going     PT LONG TERM GOAL #2   Title Active left shoulder flexion to 145 degrees so the patient can easily reach overhead.   Time 8   Period Weeks   Status On-going     PT LONG TERM GOAL #3   Title Active ER to 70 degrees+ to allow for easily  donning/doffing of apparel.   Time 8   Period Weeks   Status On-going     PT LONG TERM GOAL #4   Title Increase ROM so patient is able to reach behind back to L3.   Time 8   Period Weeks   Status On-going     PT LONG TERM GOAL #5   Title Increase left shoulder strength to a solid 4+/5 to increase stability for performance of functional activities.   Time 8   Period Weeks   Status On-going     PT LONG TERM GOAL #6   Title Perform ADL's with pain not > 2-3/10.   Time 8   Period Weeks   Status On-going               Plan - 08/12/17 1034    Clinical Impression Statement Patient continues to demonstrate good L shoulder ROM in all directions assessed today. Smooth arc of motion and firm end feels noted with all directions. Rhythmic stabilizations completed today in supine in ER and flexion with no complaints of pain from patient. Normal modalities response noted following removal of the modalities.   Rehab Potential Excellent   Clinical Impairments Affecting Rehab Potential surgery 06/30/17 current 4 weeks 07/28/17   PT Frequency 2x / week   PT Duration 8 weeks   PT Treatment/Interventions ADLs/Self Care Home Management;Cryotherapy;Electrical Stimulation;Ultrasound;Patient/family education;Neuromuscular re-education;Therapeutic exercise;Therapeutic activities;Manual techniques;Passive range of motion;Vasopneumatic Device   PT Next Visit Plan Okay to begin AAROM/PREs per protocol in media tab.   PT Home Exercise Plan isometrics; supine cane for flex/ER   Consulted and Agree with Plan of Care Patient      Patient will benefit from skilled therapeutic intervention in order to improve the following deficits and impairments:  Pain, Decreased activity tolerance, Decreased range of motion  Visit Diagnosis: Stiffness of left shoulder, not elsewhere classified  Chronic left shoulder pain     Problem List Patient Active Problem List   Diagnosis Date Noted  . Hypertension  02/22/2012  . High cholesterol 02/22/2012    Wynelle Fanny, PTA 08/12/2017, 11:15 AM  [facility] [address] [place], [place], [address] Phone: [phone]   Fax:  [phone]  Name: TEDDRICK MALLARI MRN: [id number] Date of Birth: 10/21/1956

## 2017-08-16 ENCOUNTER — Ambulatory Visit: Payer: BLUE CROSS/BLUE SHIELD | Admitting: Physical Therapy

## 2017-08-16 DIAGNOSIS — M25512 Pain in left shoulder: Secondary | ICD-10-CM

## 2017-08-16 DIAGNOSIS — M25612 Stiffness of left shoulder, not elsewhere classified: Secondary | ICD-10-CM

## 2017-08-16 DIAGNOSIS — G8929 Other chronic pain: Secondary | ICD-10-CM

## 2017-08-16 NOTE — Therapy (Signed)
[facility] [place], [place], [address] Phone: [phone]   Fax:  [phone]  Physical Therapy Treatment  Patient Details  Name: [name] MRN: [id number] Date of Birth: [birth year] Referring Provider: Earlie Server MD.  Encounter Date: 08/16/2017      PT End of Session - 08/16/17 1040    Visit Number 11   Number of Visits 16   Date for PT Re-Evaluation 09/07/17   PT Start Time 0945   PT Stop Time 1033   PT Time Calculation (min) 48 min   Activity Tolerance Patient tolerated treatment well   Behavior During Therapy [facility] for tasks assessed/performed      Past Medical History:  Diagnosis Date  . Arthritis    shoulders and hands  . Diabetes mellitus without complication ([place])   . Enlarged prostate   . GERD (gastroesophageal reflux disease)   . Hyperlipidemia   . Hypertension   . Trigger thumb of left hand     Past Surgical History:  Procedure Laterality Date  . CARPAL TUNNEL RELEASE    . COLONOSCOPY  03/24/2012   Procedure: COLONOSCOPY;  Surgeon: Rogene Houston, MD;  [facility];  Service: Endoscopy;  Laterality: N/A;  100  . SHOULDER ACROMIOPLASTY Left 06/30/2017   Procedure: SHOULDER ACROMIOPLASTY;  Surgeon: Earlie Server, MD;  Location: [place];  Service: Orthopedics;  Laterality: Left;  . SHOULDER ARTHROSCOPY WITH OPEN ROTATOR CUFF REPAIR AND DISTAL CLAVICLE ACROMINECTOMY Left 06/30/2017   Procedure: LEFT SHOULDER ARTHROSCOPIC DEBRIDEMENT, WITH OPEN ROTATOR CUFF REPAIR, OPEN DISTAL CLAVICLE EXCISION, ACROMIOPLASTY;  Surgeon: Earlie Server, MD;  Location: [place];  Service: Orthopedics;  Laterality: Left;  . TRIGGER FINGER RELEASE Left 06/30/2017   Procedure: RELEASE TRIGGER FINGER/A-1 PULLEY LEFT THUMB;  Surgeon: Earlie Server, MD;  Location: [place];  Service: Orthopedics;  Laterality: Left;    There were no vitals filed for this  visit.      Subjective Assessment - 08/16/17 1059    Subjective My shoulder hurt a lot for awhile over the weekend but it's okay now.   Patient Stated Goals get use of my left shoulder again without pain.   Pain Score 2    Pain Location Shoulder   Pain Orientation Left   Pain Descriptors / Indicators Discomfort   Pain Type Surgical pain   Pain Onset More than a month ago                         [facility] Adult PT Treatment/Exercise - 08/16/17 0001      Shoulder Exercises: Pulleys   Flexion Limitations 6 minutes.   Other Pulley Exercises UE Ranger on wall x 6 minutes into flexion, CW and CCW.   Other Pulley Exercises Wall ladder x 6 minutes.     Acupuncturist Location Left shoulder.   Electrical Stimulation Action IFC   Electrical Stimulation Parameters 80-150 Hz x 15 minutes.   Electrical Stimulation Goals Pain     Vasopneumatic   Number Minutes Vasopneumatic  15 minutes   Vasopnuematic Location  --  Left shoulder.   Vasopneumatic Pressure Medium     Manual Therapy   Manual Therapy Passive ROM   Passive ROM Sustained manual low load long duration stretch into left shoulder ER x 5 minutes.  PT Long Term Goals - 07/14/17 1105      PT LONG TERM GOAL #1   Title Independent with a HEP.   Time 8   Period Weeks   Status On-going     PT LONG TERM GOAL #2   Title Active left shoulder flexion to 145 degrees so the patient can easily reach overhead.   Time 8   Period Weeks   Status On-going     PT LONG TERM GOAL #3   Title Active ER to 70 degrees+ to allow for easily donning/doffing of apparel.   Time 8   Period Weeks   Status On-going     PT LONG TERM GOAL #4   Title Increase ROM so patient is able to reach behind back to L3.   Time 8   Period Weeks   Status On-going     PT LONG TERM GOAL #5   Title Increase left shoulder strength to a solid 4+/5 to increase stability for  performance of functional activities.   Time 8   Period Weeks   Status On-going     PT LONG TERM GOAL #6   Title Perform ADL's with pain not > 2-3/10.   Time 8   Period Weeks   Status On-going               Plan - 08/16/17 1103    Clinical Impression Statement Excellent job today with AAROM per protocol.      Patient will benefit from skilled therapeutic intervention in order to improve the following deficits and impairments:  Pain, Decreased activity tolerance, Decreased range of motion  Visit Diagnosis: Stiffness of left shoulder, not elsewhere classified  Chronic left shoulder pain     Problem List Patient Active Problem List   Diagnosis Date Noted  . Hypertension 02/22/2012  . High cholesterol 02/22/2012    Prather Failla, [place] MPT 08/16/2017, 11:04 AM  [facility] [address] [place], [place], [address] Phone: [phone]   Fax:  [phone]  Name: [name] MRN: [id number] Date of Birth: 1956-04-22

## 2017-08-18 ENCOUNTER — Encounter: Payer: Self-pay | Admitting: Physical Therapy

## 2017-08-18 ENCOUNTER — Ambulatory Visit: Payer: BLUE CROSS/BLUE SHIELD | Admitting: Physical Therapy

## 2017-08-18 DIAGNOSIS — M25512 Pain in left shoulder: Secondary | ICD-10-CM

## 2017-08-18 DIAGNOSIS — M25612 Stiffness of left shoulder, not elsewhere classified: Secondary | ICD-10-CM | POA: Diagnosis not present

## 2017-08-18 DIAGNOSIS — G8929 Other chronic pain: Secondary | ICD-10-CM

## 2017-08-18 NOTE — Therapy (Signed)
[facility] [place], [place], [address] Phone: [phone]   Fax:  [phone]  Physical Therapy Treatment  Patient Details  Name: [name] MRN: [id number] Date of Birth: 07/27/1956 Referring Provider: Earlie Server MD.  Encounter Date: 08/18/2017      PT End of Session - 08/18/17 0954    Visit Number 12   Number of Visits 16   Date for PT Re-Evaluation 09/07/17   PT Start Time 0948   PT Stop Time 1040   PT Time Calculation (min) 52 min   Activity Tolerance Patient tolerated treatment well   Behavior During Therapy [facility] for tasks assessed/performed      Past Medical History:  Diagnosis Date  . Arthritis    shoulders and hands  . Diabetes mellitus without complication ([place])   . Enlarged prostate   . GERD (gastroesophageal reflux disease)   . Hyperlipidemia   . Hypertension   . Trigger thumb of left hand     Past Surgical History:  Procedure Laterality Date  . CARPAL TUNNEL RELEASE    . COLONOSCOPY  03/24/2012   Procedure: COLONOSCOPY;  Surgeon: Rogene Houston, MD;  [facility];  Service: Endoscopy;  Laterality: N/A;  100  . SHOULDER ACROMIOPLASTY Left 06/30/2017   Procedure: SHOULDER ACROMIOPLASTY;  Surgeon: Earlie Server, MD;  Location: [place];  Service: Orthopedics;  Laterality: Left;  . SHOULDER ARTHROSCOPY WITH OPEN ROTATOR CUFF REPAIR AND DISTAL CLAVICLE ACROMINECTOMY Left 06/30/2017   Procedure: LEFT SHOULDER ARTHROSCOPIC DEBRIDEMENT, WITH OPEN ROTATOR CUFF REPAIR, OPEN DISTAL CLAVICLE EXCISION, ACROMIOPLASTY;  Surgeon: Earlie Server, MD;  Location: [place];  Service: Orthopedics;  Laterality: Left;  . TRIGGER FINGER RELEASE Left 06/30/2017   Procedure: RELEASE TRIGGER FINGER/A-1 PULLEY LEFT THUMB;  Surgeon: Earlie Server, MD;  Location: [place];  Service: Orthopedics;  Laterality: Left;    There were no vitals filed for this  visit.      Subjective Assessment - 08/18/17 0953    Subjective Reports that his shoulder feels pretty good and had no pain last night.   Patient Stated Goals get use of my left shoulder again without pain.   Currently in Pain? Yes   Pain Score 2    Pain Location Shoulder   Pain Orientation Left   Pain Descriptors / Indicators Discomfort   Pain Type Surgical pain   Pain Onset More than a month ago   Pain Frequency Intermittent            [facility] PT Assessment - 08/18/17 0001      Assessment   Medical Diagnosis Open left RTC repair.   Onset Date/Surgical Date 06/30/17   Next MD Visit 08/24/2017     Precautions   Precaution Comments Please see protocol.  Begin with left shoulder PROM     Restrictions   Weight Bearing Restrictions No                     [facility] Adult PT Treatment/Exercise - 08/18/17 0001      Shoulder Exercises: Supine   Protraction AROM;Left;20 reps     Shoulder Exercises: Prone   Retraction AROM;Left;20 reps   Flexion AROM;Left;20 reps   Extension AROM;Left;20 reps     Shoulder Exercises: Sidelying   External Rotation AROM;Left;20 reps     Shoulder Exercises: Pulleys   Flexion Other (comment)  x5 min   Other Pulley Exercises UE ranger into flexion, circles x20  reps   Other Pulley Exercises Wall ladder x10 reps     Shoulder Exercises: ROM/Strengthening   UBE (Upper Arm Bike) 120 RPM x3 min     Shoulder Exercises: Isometric Strengthening   Flexion 5X10"   Extension 5X10"   External Rotation 5X10"   Internal Rotation 5X10"     Modalities   Modalities Electrical Stimulation;Vasopneumatic     Electrical Stimulation   Electrical Stimulation Location L shoulder   Electrical Stimulation Action Pre-Mod   Electrical Stimulation Parameters 80-150 hz x15 min   Electrical Stimulation Goals Pain     Vasopneumatic   Number Minutes Vasopneumatic  15 minutes   Vasopnuematic Location  Shoulder   Vasopneumatic Pressure Medium    Vasopneumatic Temperature  34     Manual Therapy   Manual Therapy Passive ROM   Passive ROM PROM of L shoulder into flex/ER/IR with holds at end range                     PT Long Term Goals - 08/18/17 1034      PT LONG TERM GOAL #1   Title Independent with a HEP.   Time 8   Period Weeks   Status Achieved     PT LONG TERM GOAL #2   Title Active left shoulder flexion to 145 degrees so the patient can easily reach overhead.   Time 8   Period Weeks   Status On-going     PT LONG TERM GOAL #3   Title Active ER to 70 degrees+ to allow for easily donning/doffing of apparel.   Time 8   Period Weeks   Status On-going     PT LONG TERM GOAL #4   Title Increase ROM so patient is able to reach behind back to L3.   Time 8   Period Weeks   Status On-going     PT LONG TERM GOAL #5   Title Increase left shoulder strength to a solid 4+/5 to increase stability for performance of functional activities.   Time 8   Period Weeks   Status On-going     PT LONG TERM GOAL #6   Title Perform ADL's with pain not > 2-3/10.   Time 8   Period Weeks   Status Achieved               Plan - 08/18/17 1035    Clinical Impression Statement Patient tolerated today's treatment well with AAROM exercises as well isometric strengthening. Patient does experience pain with isometrics of L shoulder especially ER per patient report but reported as tolerable. No other pain reports provided by patient today during treatment. Smooth arc of motion and firm end feels noted with PROM of L shoulder into all directions assessed. Normal modalities response noted following removal of the modalities.   Rehab Potential Excellent   Clinical Impairments Affecting Rehab Potential surgery 06/30/17 current 4 weeks 07/28/17   PT Frequency 2x / week   PT Duration 8 weeks   PT Treatment/Interventions ADLs/Self Care Home Management;Cryotherapy;Electrical Stimulation;Ultrasound;Patient/family  education;Neuromuscular re-education;Therapeutic exercise;Therapeutic activities;Manual techniques;Passive range of motion;Vasopneumatic Device   PT Next Visit Plan Okay to begin AAROM/PREs per protocol in media tab.   PT Home Exercise Plan isometrics; supine cane for flex/ER   Consulted and Agree with Plan of Care Patient      Patient will benefit from skilled therapeutic intervention in order to improve the following deficits and impairments:  Pain, Decreased activity tolerance, Decreased range of motion  Visit Diagnosis: Stiffness of left shoulder, not elsewhere classified  Chronic left shoulder pain     Problem List Patient Active Problem List   Diagnosis Date Noted  . Hypertension 02/22/2012  . High cholesterol 02/22/2012    Wynelle Fanny, PTA 08/18/2017, 10:44 AM  [facility] [address] [place], [place], [address] Phone: [phone]   Fax:  [phone]  Name: [name] MRN: [id number] Date of Birth: July 06, 1956

## 2017-08-19 ENCOUNTER — Ambulatory Visit: Payer: BLUE CROSS/BLUE SHIELD | Admitting: Physical Therapy

## 2017-08-19 ENCOUNTER — Encounter: Payer: Self-pay | Admitting: Physical Therapy

## 2017-08-19 DIAGNOSIS — M25612 Stiffness of left shoulder, not elsewhere classified: Secondary | ICD-10-CM

## 2017-08-19 DIAGNOSIS — G8929 Other chronic pain: Secondary | ICD-10-CM

## 2017-08-19 DIAGNOSIS — M25512 Pain in left shoulder: Secondary | ICD-10-CM

## 2017-08-19 NOTE — Therapy (Signed)
[facility] [place], [place], [address] Phone: [phone]   Fax:  [phone]  Physical Therapy Treatment  Patient Details  Name: [name] MRN: [id number] Date of Birth: [birth year] Referring Provider: Earlie Server MD.  Encounter Date: 08/19/2017      PT End of Session - 08/19/17 0951    Visit Number 13   Number of Visits 16   Date for PT Re-Evaluation 09/07/17   PT Start Time 0948   PT Stop Time 1048   PT Time Calculation (min) 60 min   Activity Tolerance Patient tolerated treatment well   Behavior During Therapy [facility] for tasks assessed/performed      Past Medical History:  Diagnosis Date  . Arthritis    shoulders and hands  . Diabetes mellitus without complication ([place])   . Enlarged prostate   . GERD (gastroesophageal reflux disease)   . Hyperlipidemia   . Hypertension   . Trigger thumb of left hand     Past Surgical History:  Procedure Laterality Date  . CARPAL TUNNEL RELEASE    . COLONOSCOPY  03/24/2012   Procedure: COLONOSCOPY;  Surgeon: Rogene Houston, MD;  [facility];  Service: Endoscopy;  Laterality: N/A;  100  . SHOULDER ACROMIOPLASTY Left 06/30/2017   Procedure: SHOULDER ACROMIOPLASTY;  Surgeon: Earlie Server, MD;  Location: [place];  Service: Orthopedics;  Laterality: Left;  . SHOULDER ARTHROSCOPY WITH OPEN ROTATOR CUFF REPAIR AND DISTAL CLAVICLE ACROMINECTOMY Left 06/30/2017   Procedure: LEFT SHOULDER ARTHROSCOPIC DEBRIDEMENT, WITH OPEN ROTATOR CUFF REPAIR, OPEN DISTAL CLAVICLE EXCISION, ACROMIOPLASTY;  Surgeon: Earlie Server, MD;  Location: [place];  Service: Orthopedics;  Laterality: Left;  . TRIGGER FINGER RELEASE Left 06/30/2017   Procedure: RELEASE TRIGGER FINGER/A-1 PULLEY LEFT THUMB;  Surgeon: Earlie Server, MD;  Location: [place];  Service: Orthopedics;  Laterality: Left;    There were no vitals filed for this  visit.      Subjective Assessment - 08/19/17 0948    Subjective Reports his shoulder is doing good.   Patient Stated Goals get use of my left shoulder again without pain.   Currently in Pain? No/denies            [facility] PT Assessment - 08/19/17 0001      Assessment   Medical Diagnosis Open left RTC repair.   Onset Date/Surgical Date 06/30/17   Next MD Visit 08/24/2017     Precautions   Precaution Comments Please see protocol.  Begin with left shoulder PROM     Restrictions   Weight Bearing Restrictions No                     [facility] Adult PT Treatment/Exercise - 08/19/17 0001      Shoulder Exercises: Supine   Protraction AROM;Left;20 reps   External Rotation AAROM;Left;15 reps   Flexion AAROM;Both;10 reps     Shoulder Exercises: Prone   Retraction AROM;Left;20 reps   Flexion AROM;Left;20 reps   Extension AROM;Left;20 reps     Shoulder Exercises: Sidelying   External Rotation AROM;Left;20 reps     Shoulder Exercises: Pulleys   Flexion Other (comment)  x5 min   Other Pulley Exercises UE ranger into flexion, circles x20 reps   Other Pulley Exercises Wall ladder x10 reps     Shoulder Exercises: ROM/Strengthening   UBE (Upper Arm Bike) 120 RPM x5 min     Shoulder Exercises: Isometric Strengthening   Flexion 5X10"  Extension 5X10"   External Rotation 5X10"   Internal Rotation 5X10"     Modalities   Modalities Passenger transport manager Location L shoulder   Electrical Stimulation Action Pre-Mod   Electrical Stimulation Parameters 80-150 hz x15 min   Electrical Stimulation Goals Pain     Vasopneumatic   Number Minutes Vasopneumatic  15 minutes   Vasopnuematic Location  Shoulder   Vasopneumatic Pressure Medium   Vasopneumatic Temperature  34     Manual Therapy   Manual Therapy Passive ROM   Passive ROM PROM of L shoulder into flex/ER/IR with holds at end range                      PT Long Term Goals - 08/18/17 1034      PT LONG TERM GOAL #1   Title Independent with a HEP.   Time 8   Period Weeks   Status Achieved     PT LONG TERM GOAL #2   Title Active left shoulder flexion to 145 degrees so the patient can easily reach overhead.   Time 8   Period Weeks   Status On-going     PT LONG TERM GOAL #3   Title Active ER to 70 degrees+ to allow for easily donning/doffing of apparel.   Time 8   Period Weeks   Status On-going     PT LONG TERM GOAL #4   Title Increase ROM so patient is able to reach behind back to L3.   Time 8   Period Weeks   Status On-going     PT LONG TERM GOAL #5   Title Increase left shoulder strength to a solid 4+/5 to increase stability for performance of functional activities.   Time 8   Period Weeks   Status On-going     PT LONG TERM GOAL #6   Title Perform ADL's with pain not > 2-3/10.   Time 8   Period Weeks   Status Achieved               Plan - 08/19/17 1046    Clinical Impression Statement Patient continues to excel in PT with AAROM exercises and isometrics. Patient denies any pain during exercises and reported his shoulder feeling better during exercises. Patient guided through more AAROM exercises in supine according protocol. Smooth arc of motion and firm end feels noted with all directions of PROM of L shoulder assessed. Normal modalities response noted following removal of the modalities.   Rehab Potential Excellent   Clinical Impairments Affecting Rehab Potential surgery 06/30/17 current 4 weeks 07/28/17   PT Frequency 2x / week   PT Duration 8 weeks   PT Treatment/Interventions ADLs/Self Care Home Management;Cryotherapy;Electrical Stimulation;Ultrasound;Patient/family education;Neuromuscular re-education;Therapeutic exercise;Therapeutic activities;Manual techniques;Passive range of motion;Vasopneumatic Device   PT Next Visit Plan Okay to begin AAROM/PREs per protocol in media tab.    PT Home Exercise Plan isometrics; supine cane for flex/ER   Consulted and Agree with Plan of Care Patient      Patient will benefit from skilled therapeutic intervention in order to improve the following deficits and impairments:  Pain, Decreased activity tolerance, Decreased range of motion  Visit Diagnosis: Stiffness of left shoulder, not elsewhere classified  Chronic left shoulder pain     Problem List Patient Active Problem List   Diagnosis Date Noted  . Hypertension 02/22/2012  . High cholesterol 02/22/2012    Wynelle Fanny, PTA 08/19/2017,  11:02 AM  [facility] [place], [place], [address] Phone: [phone]   Fax:  [phone]  Name: [name] MRN: [id number] Date of Birth: 1956-08-15

## 2017-08-23 ENCOUNTER — Ambulatory Visit: Payer: BLUE CROSS/BLUE SHIELD | Admitting: Physical Therapy

## 2017-08-23 DIAGNOSIS — M6281 Muscle weakness (generalized): Secondary | ICD-10-CM

## 2017-08-23 DIAGNOSIS — M25612 Stiffness of left shoulder, not elsewhere classified: Secondary | ICD-10-CM | POA: Diagnosis not present

## 2017-08-23 NOTE — Therapy (Signed)
[facility] [place], [place], [address] Phone: [phone]   Fax:  [phone]  Physical Therapy Treatment  Patient Details  Name: [name] MRN: [id number] Date of Birth: [birth year] Referring Provider: Earlie Server MD.  Encounter Date: 08/23/2017      PT End of Session - 08/23/17 0949    Visit Number 14   Number of Visits 16   Date for PT Re-Evaluation 09/07/17   PT Start Time 0945   PT Stop Time 1037   PT Time Calculation (min) 52 min   Activity Tolerance Patient tolerated treatment well   Behavior During Therapy [facility] for tasks assessed/performed      Past Medical History:  Diagnosis Date  . Arthritis    shoulders and hands  . Diabetes mellitus without complication ([place])   . Enlarged prostate   . GERD (gastroesophageal reflux disease)   . Hyperlipidemia   . Hypertension   . Trigger thumb of left hand     Past Surgical History:  Procedure Laterality Date  . CARPAL TUNNEL RELEASE    . COLONOSCOPY  03/24/2012   Procedure: COLONOSCOPY;  Surgeon: Rogene Houston, MD;  [facility];  Service: Endoscopy;  Laterality: N/A;  100  . SHOULDER ACROMIOPLASTY Left 06/30/2017   Procedure: SHOULDER ACROMIOPLASTY;  Surgeon: Earlie Server, MD;  Location: [place];  Service: Orthopedics;  Laterality: Left;  . SHOULDER ARTHROSCOPY WITH OPEN ROTATOR CUFF REPAIR AND DISTAL CLAVICLE ACROMINECTOMY Left 06/30/2017   Procedure: LEFT SHOULDER ARTHROSCOPIC DEBRIDEMENT, WITH OPEN ROTATOR CUFF REPAIR, OPEN DISTAL CLAVICLE EXCISION, ACROMIOPLASTY;  Surgeon: Earlie Server, MD;  Location: [place];  Service: Orthopedics;  Laterality: Left;  . TRIGGER FINGER RELEASE Left 06/30/2017   Procedure: RELEASE TRIGGER FINGER/A-1 PULLEY LEFT THUMB;  Surgeon: Earlie Server, MD;  Location: [place];  Service: Orthopedics;  Laterality: Left;    There were no vitals filed for this  visit.      Subjective Assessment - 08/23/17 0950    Subjective Reports his shoulder is doing good. Still hurts a little with extension from [facility] position.   Patient Stated Goals get use of my left shoulder again without pain.   Currently in Pain? No/denies            [facility] PT Assessment - 08/23/17 0001      Assessment   Medical Diagnosis Open left RTC repair.   Onset Date/Surgical Date 06/30/17   Next MD Visit 08/24/17                     [facility] Adult PT Treatment/Exercise - 08/23/17 0001      Shoulder Exercises: Supine   Other Supine Exercises PNF D1/D2 x 20     Shoulder Exercises: Sidelying   External Rotation Strengthening;AROM;20 reps;Weights  20 ea no wt and 1# wt   External Rotation Weight (lbs) 1     Shoulder Exercises: Standing   Protraction Strengthening;Left;20 reps   Flexion Strengthening;Left   Flexion Limitations cone stacking x2 to 3rd shelf     Shoulder Exercises: Pulleys   Flexion Other (comment)  x5 min     Shoulder Exercises: ROM/Strengthening   UBE (Upper Arm Bike) 90 RPM x5 min     Shoulder Exercises: Isometric Strengthening   Flexion 5X10"   Extension 5X10"   External Rotation 5X10"   Internal Rotation 5X10"   ABduction 5X10"     Modalities   Modalities Vasopneumatic  Vasopneumatic   Number Minutes Vasopneumatic  15 minutes   Vasopnuematic Location  Shoulder   Vasopneumatic Pressure Medium   Vasopneumatic Temperature  34                     PT Long Term Goals - 08/23/17 0950      PT LONG TERM GOAL #1   Title Independent with a HEP.   Time 8   Period Weeks   Status Achieved     PT LONG TERM GOAL #2   Title Active left shoulder flexion to 145 degrees so the patient can easily reach overhead.   Baseline 155 as of 08/23/17   Time 8   Period Weeks   Status Achieved     PT LONG TERM GOAL #3   Title Active ER to 70 degrees+ to allow for easily donning/doffing of apparel.   Baseline 90 deg in  standing as of 08/23/17   Time 8   Period Weeks   Status Achieved     PT LONG TERM GOAL #4   Title Increase ROM so patient is able to reach behind back to put belt on, tuck in shirt and towel off after shower.   Baseline Goal revised from reaching to L3 to functional goal above. Patient able to reach to L4 as of 08/23/17.   Time 8   Period Weeks   Status Achieved     PT LONG TERM GOAL #5   Title Increase left shoulder strength to a solid 4+/5 to increase stability for performance of functional activities.   Baseline Left shoulder 4/4 grossly   Time 8   Period Weeks   Status On-going     PT LONG TERM GOAL #6   Title Perform ADL's with pain not > 2-3/10.   Time 8   Period Weeks   Status Achieved               Plan - 08/23/17 1025    Clinical Impression Statement Patient is doing very well meeting all LTGs except strength at this point. His pain is minimal, but shoulder fatigues with activity. He demonstrates at least 4/5 strength in the left shoulder with sub max MMT. He continues to work on ROM for flexion and IR although functional range has been met.   Rehab Potential Excellent   Clinical Impairments Affecting Rehab Potential surgery 06/30/17    PT Treatment/Interventions ADLs/Self Care Home Management;Cryotherapy;Electrical Stimulation;Ultrasound;Patient/family education;Neuromuscular re-education;Therapeutic exercise;Therapeutic activities;Manual techniques;Passive range of motion;Vasopneumatic Device   PT Next Visit Plan Await MD orders for further strengthening   PT Home Exercise Plan isometrics; supine cane for flex/ER   Consulted and Agree with Plan of Care Patient      Patient will benefit from skilled therapeutic intervention in order to improve the following deficits and impairments:  Pain, Decreased activity tolerance, Decreased range of motion, Decreased strength  Visit Diagnosis: Stiffness of left shoulder, not elsewhere classified  Muscle weakness  (generalized)     Problem List Patient Active Problem List   Diagnosis Date Noted  . Hypertension 02/22/2012  . High cholesterol 02/22/2012    Madelyn Flavors PT 08/23/2017, 2:05 PM  [facility] [address] [place], [place], [address] Phone: [phone]   Fax:  [phone]  Name: IVIE MAESE MRN: [id number] Date of Birth: June 03, 1956

## 2017-08-26 ENCOUNTER — Ambulatory Visit: Payer: BLUE CROSS/BLUE SHIELD | Admitting: *Deleted

## 2017-08-26 DIAGNOSIS — M25612 Stiffness of left shoulder, not elsewhere classified: Secondary | ICD-10-CM

## 2017-08-26 DIAGNOSIS — G8929 Other chronic pain: Secondary | ICD-10-CM

## 2017-08-26 DIAGNOSIS — M6281 Muscle weakness (generalized): Secondary | ICD-10-CM

## 2017-08-26 DIAGNOSIS — M25512 Pain in left shoulder: Secondary | ICD-10-CM

## 2017-08-26 NOTE — Therapy (Signed)
[facility] [place], [place], [address] Phone: [phone]   Fax:  [phone]  Physical Therapy Treatment  Patient Details  Name: [name] MRN: [id number] Date of Birth: 21-Jun-1956 Referring Provider: Earlie Server MD.  Encounter Date: 08/26/2017      PT End of Session - 08/26/17 0950    Visit Number 15   Date for PT Re-Evaluation 09/07/17   PT Start Time 0945   PT Stop Time 5397   PT Time Calculation (min) 50 min      Past Medical History:  Diagnosis Date  . Arthritis    shoulders and hands  . Diabetes mellitus without complication ([place])   . Enlarged prostate   . GERD (gastroesophageal reflux disease)   . Hyperlipidemia   . Hypertension   . Trigger thumb of left hand     Past Surgical History:  Procedure Laterality Date  . CARPAL TUNNEL RELEASE    . COLONOSCOPY  03/24/2012   Procedure: COLONOSCOPY;  Surgeon: Rogene Houston, MD;  [facility];  Service: Endoscopy;  Laterality: N/A;  100  . SHOULDER ACROMIOPLASTY Left 06/30/2017   Procedure: SHOULDER ACROMIOPLASTY;  Surgeon: Earlie Server, MD;  Location: [place];  Service: Orthopedics;  Laterality: Left;  . SHOULDER ARTHROSCOPY WITH OPEN ROTATOR CUFF REPAIR AND DISTAL CLAVICLE ACROMINECTOMY Left 06/30/2017   Procedure: LEFT SHOULDER ARTHROSCOPIC DEBRIDEMENT, WITH OPEN ROTATOR CUFF REPAIR, OPEN DISTAL CLAVICLE EXCISION, ACROMIOPLASTY;  Surgeon: Earlie Server, MD;  Location: [place];  Service: Orthopedics;  Laterality: Left;  . TRIGGER FINGER RELEASE Left 06/30/2017   Procedure: RELEASE TRIGGER FINGER/A-1 PULLEY LEFT THUMB;  Surgeon: Earlie Server, MD;  Location: [place];  Service: Orthopedics;  Laterality: Left;    There were no vitals filed for this visit.      Subjective Assessment - 08/26/17 0947    Subjective Went to MD and he said to cont PT.     Nov. 12 next MD Appt   Patient  Stated Goals get use of my left shoulder again without pain.   Currently in Pain? No/denies                         [facility] Adult PT Treatment/Exercise - 08/26/17 0001      Elbow Exercises   Elbow Flexion Strengthening;10 reps;20 reps  3#     Shoulder Exercises: Prone   Retraction Strengthening;Left;10 reps;20 reps  3#   Extension AROM;Left;20 reps;10 reps  3#     Shoulder Exercises: Standing   External Rotation Strengthening;Left;10 reps;20 reps   Theraband Level (Shoulder External Rotation) Level 1 (Yellow)   Internal Rotation Strengthening;Left;10 reps;20 reps   Theraband Level (Shoulder Internal Rotation) Level 1 (Yellow)   Flexion Strengthening;Left;20 reps;10 reps   Shoulder Flexion Weight (lbs) 1   Extension Strengthening;Left;10 reps;20 reps;Theraband   Theraband Level (Shoulder Extension) Level 1 (Yellow)   Row Strengthening;Left;10 reps;20 reps;Theraband     Shoulder Exercises: ROM/Strengthening   UBE (Upper Arm Bike) 90 RPM x  6 min     Modalities   Modalities Vasopneumatic     Vasopneumatic   Number Minutes Vasopneumatic  15 minutes   Vasopnuematic Location  Shoulder   Vasopneumatic Pressure Medium   Vasopneumatic Temperature  34                PT Education - 08/26/17 1027    Education provided Yes   Education Details [phone]  Person(s) Educated Patient   Methods Demonstration;Explanation;Tactile cues;Verbal cues;Handout   Comprehension Verbalized understanding;Returned demonstration             PT Long Term Goals - 08/23/17 0950      PT LONG TERM GOAL #1   Title Independent with a HEP.   Time 8   Period Weeks   Status Achieved     PT LONG TERM GOAL #2   Title Active left shoulder flexion to 145 degrees so the patient can easily reach overhead.   Baseline 155 as of 08/23/17   Time 8   Period Weeks   Status Achieved     PT LONG TERM GOAL #3   Title Active ER to 70 degrees+ to allow for easily donning/doffing of  apparel.   Baseline 90 deg in standing as of 08/23/17   Time 8   Period Weeks   Status Achieved     PT LONG TERM GOAL #4   Title Increase ROM so patient is able to reach behind back to put belt on, tuck in shirt and towel off after shower.   Baseline Goal revised from reaching to L3 to functional goal above. Patient able to reach to L4 as of 08/23/17.   Time 8   Period Weeks   Status Achieved     PT LONG TERM GOAL #5   Title Increase left shoulder strength to a solid 4+/5 to increase stability for performance of functional activities.   Baseline Left shoulder 4/4 grossly   Time 8   Period Weeks   Status On-going     PT LONG TERM GOAL #6   Title Perform ADL's with pain not > 2-3/10.   Time 8   Period Weeks   Status Achieved               Plan - 08/26/17 1110    Clinical Impression Statement Pt arrived today doing fairly well with no LT shldr pain and reports MD was pleased with status and wants him to continue with PT and start strengthening. He did well with PREs and was given RW4s for HEP   Clinical Decision Making Low   Rehab Potential Excellent   Clinical Impairments Affecting Rehab Potential surgery 06/30/17    PT Frequency 2x / week   PT Duration 8 weeks   PT Treatment/Interventions ADLs/Self Care Home Management;Cryotherapy;Electrical Stimulation;Ultrasound;Patient/family education;Neuromuscular re-education;Therapeutic exercise;Therapeutic activities;Manual techniques;Passive range of motion;Vasopneumatic Device   PT Home Exercise Plan isometrics; supine cane for flex/ER   MD to fax new order.   Consulted and Agree with Plan of Care Patient      Patient will benefit from skilled therapeutic intervention in order to improve the following deficits and impairments:  Pain, Decreased activity tolerance, Decreased range of motion, Decreased strength  Visit Diagnosis: Stiffness of left shoulder, not elsewhere classified  Muscle weakness (generalized)  Chronic  left shoulder pain     Problem List Patient Active Problem List   Diagnosis Date Noted  . Hypertension 02/22/2012  . High cholesterol 02/22/2012    RAMSEUR,CHRIS, PTA 08/26/2017, 11:12 AM  [facility] [place], [place], [address] Phone: [phone]   Fax:  [phone]  Name: [name] MRN: [id number] Date of Birth: [birth year]

## 2017-08-30 ENCOUNTER — Encounter: Payer: Self-pay | Admitting: Physical Therapy

## 2017-08-30 ENCOUNTER — Ambulatory Visit: Payer: BLUE CROSS/BLUE SHIELD | Admitting: Physical Therapy

## 2017-08-30 DIAGNOSIS — M25612 Stiffness of left shoulder, not elsewhere classified: Secondary | ICD-10-CM | POA: Diagnosis not present

## 2017-08-30 DIAGNOSIS — M6281 Muscle weakness (generalized): Secondary | ICD-10-CM

## 2017-08-30 NOTE — Addendum Note (Signed)
Addended by: APPLEGATE, Mali W on: 08/30/2017 11:49 AM   Modules accepted: Orders

## 2017-08-30 NOTE — Therapy (Signed)
[facility] [place], [place], [address] Phone: [phone]   Fax:  [phone]  Physical Therapy Treatment  Patient Details  Name: [name] MRN: [id number] Date of Birth: Dec 20, 1955 Referring Provider: Earlie Server MD.  Encounter Date: 08/30/2017      PT End of Session - 08/30/17 1012    Visit Number 16   Number of Visits 24   Date for PT Re-Evaluation 09/23/17   PT Start Time 0945   PT Stop Time 1028   PT Time Calculation (min) 43 min   Activity Tolerance Patient tolerated treatment well   Behavior During Therapy [facility] for tasks assessed/performed      Past Medical History:  Diagnosis Date  . Arthritis    shoulders and hands  . Diabetes mellitus without complication ([place])   . Enlarged prostate   . GERD (gastroesophageal reflux disease)   . Hyperlipidemia   . Hypertension   . Trigger thumb of left hand     Past Surgical History:  Procedure Laterality Date  . CARPAL TUNNEL RELEASE    . COLONOSCOPY  03/24/2012   Procedure: COLONOSCOPY;  Surgeon: Rogene Houston, MD;  [facility];  Service: Endoscopy;  Laterality: N/A;  100  . SHOULDER ACROMIOPLASTY Left 06/30/2017   Procedure: SHOULDER ACROMIOPLASTY;  Surgeon: Earlie Server, MD;  Location: [place];  Service: Orthopedics;  Laterality: Left;  . SHOULDER ARTHROSCOPY WITH OPEN ROTATOR CUFF REPAIR AND DISTAL CLAVICLE ACROMINECTOMY Left 06/30/2017   Procedure: LEFT SHOULDER ARTHROSCOPIC DEBRIDEMENT, WITH OPEN ROTATOR CUFF REPAIR, OPEN DISTAL CLAVICLE EXCISION, ACROMIOPLASTY;  Surgeon: Earlie Server, MD;  Location: [place];  Service: Orthopedics;  Laterality: Left;  . TRIGGER FINGER RELEASE Left 06/30/2017   Procedure: RELEASE TRIGGER FINGER/A-1 PULLEY LEFT THUMB;  Surgeon: Earlie Server, MD;  Location: [place];  Service: Orthopedics;  Laterality: Left;    There were no vitals filed for this  visit.      Subjective Assessment - 08/30/17 0948    Subjective Patient reported overall improvement and good today   Patient Stated Goals get use of my left shoulder again without pain.   Currently in Pain? No/denies                         [facility] Adult PT Treatment/Exercise - 08/30/17 0001      Shoulder Exercises: Prone   Retraction Strengthening;Left;Weights  3x10   Retraction Weight (lbs) 3   Extension Strengthening;Left;Weights  3x10   Extension Weight (lbs) 3     Shoulder Exercises: Sidelying   External Rotation Strengthening;Left;Weights  3x10   External Rotation Weight (lbs) 2     Shoulder Exercises: Standing   Protraction Strengthening;Left;20 reps   Theraband Level (Shoulder Protraction) Level 1 (Yellow)   External Rotation Strengthening;Left;10 reps;20 reps   Theraband Level (Shoulder External Rotation) Level 1 (Yellow)   Internal Rotation Strengthening;Left;10 reps;20 reps   Theraband Level (Shoulder Internal Rotation) Level 1 (Yellow)   Flexion Strengthening;Left;Weights  3x10   Shoulder Flexion Weight (lbs) 1   Flexion Limitations in scaption   Extension Strengthening;Left;20 reps;Theraband   Theraband Level (Shoulder Extension) Level 1 (Yellow)   Other Standing Exercises wall slide with eccentic lower x20, no difficulty     Shoulder Exercises: ROM/Strengthening   UBE (Upper Arm Bike) 90 RPM x  6 min   Wall Pushups 20 reps  PT Long Term Goals - 08/23/17 0950      PT LONG TERM GOAL #1   Title Independent with a HEP.   Time 8   Period Weeks   Status Achieved     PT LONG TERM GOAL #2   Title Active left shoulder flexion to 145 degrees so the patient can easily reach overhead.   Baseline 155 as of 08/23/17   Time 8   Period Weeks   Status Achieved     PT LONG TERM GOAL #3   Title Active ER to 70 degrees+ to allow for easily donning/doffing of apparel.   Baseline 90 deg in standing as of 08/23/17    Time 8   Period Weeks   Status Achieved     PT LONG TERM GOAL #4   Title Increase ROM so patient is able to reach behind back to put belt on, tuck in shirt and towel off after shower.   Baseline Goal revised from reaching to L3 to functional goal above. Patient able to reach to L4 as of 08/23/17.   Time 8   Period Weeks   Status Achieved     PT LONG TERM GOAL #5   Title Increase left shoulder strength to a solid 4+/5 to increase stability for performance of functional activities.   Baseline Left shoulder 4/4 grossly   Time 8   Period Weeks   Status On-going     PT LONG TERM GOAL #6   Title Perform ADL's with pain not > 2-3/10.   Time 8   Period Weeks   Status Achieved               Plan - 08/30/17 1013    Clinical Impression Statement Patient tolerated treatment well today. Patient has reported no pain overall and has been doing HEP as directed with yellow t-band. Patient progressing with left shoulder strengthening with good technique and minimal fatigue. Patient remaining goals ongoing due to strength limitations.    Rehab Potential Excellent   Clinical Impairments Affecting Rehab Potential surgery 06/30/17    PT Frequency 2x / week   PT Duration 8 weeks   PT Next Visit Plan cont with POC    Consulted and Agree with Plan of Care Patient      Patient will benefit from skilled therapeutic intervention in order to improve the following deficits and impairments:  Pain, Decreased activity tolerance, Decreased range of motion, Decreased strength  Visit Diagnosis: Stiffness of left shoulder, not elsewhere classified  Muscle weakness (generalized)     Problem List Patient Active Problem List   Diagnosis Date Noted  . Hypertension 02/22/2012  . High cholesterol 02/22/2012    Pasha Gadison P, PTA 08/30/2017, 10:28 AM  [facility] [place], [place], [address] Phone: [phone]   Fax:   [phone]  Name: [name] MRN: [id number] Date of Birth: 1956/07/28

## 2017-09-02 ENCOUNTER — Encounter: Payer: Self-pay | Admitting: Physical Therapy

## 2017-09-02 ENCOUNTER — Ambulatory Visit: Payer: BLUE CROSS/BLUE SHIELD | Admitting: Physical Therapy

## 2017-09-02 DIAGNOSIS — M25612 Stiffness of left shoulder, not elsewhere classified: Secondary | ICD-10-CM

## 2017-09-02 DIAGNOSIS — M25512 Pain in left shoulder: Secondary | ICD-10-CM

## 2017-09-02 DIAGNOSIS — G8929 Other chronic pain: Secondary | ICD-10-CM

## 2017-09-02 DIAGNOSIS — M6281 Muscle weakness (generalized): Secondary | ICD-10-CM

## 2017-09-02 NOTE — Therapy (Signed)
[facility] [place], [place], [address] Phone: [phone]   Fax:  [phone]  Physical Therapy Treatment  Patient Details  Name: [name] MRN: [id number] Date of Birth: 29-Jan-1956 Referring Provider: Earlie Server MD.  Encounter Date: 09/02/2017      PT End of Session - 09/02/17 1224    Visit Number 17   Number of Visits 24   Date for PT Re-Evaluation 09/23/17   PT Start Time 0900   PT Stop Time 0950   PT Time Calculation (min) 50 min   Activity Tolerance Patient tolerated treatment well   Behavior During Therapy [facility] for tasks assessed/performed      Past Medical History:  Diagnosis Date  . Arthritis    shoulders and hands  . Diabetes mellitus without complication ([place])   . Enlarged prostate   . GERD (gastroesophageal reflux disease)   . Hyperlipidemia   . Hypertension   . Trigger thumb of left hand     Past Surgical History:  Procedure Laterality Date  . CARPAL TUNNEL RELEASE    . COLONOSCOPY  03/24/2012   Procedure: COLONOSCOPY;  Surgeon: Rogene Houston, MD;  [facility];  Service: Endoscopy;  Laterality: N/A;  100  . SHOULDER ACROMIOPLASTY Left 06/30/2017   Procedure: SHOULDER ACROMIOPLASTY;  Surgeon: Earlie Server, MD;  Location: [place];  Service: Orthopedics;  Laterality: Left;  . SHOULDER ARTHROSCOPY WITH OPEN ROTATOR CUFF REPAIR AND DISTAL CLAVICLE ACROMINECTOMY Left 06/30/2017   Procedure: LEFT SHOULDER ARTHROSCOPIC DEBRIDEMENT, WITH OPEN ROTATOR CUFF REPAIR, OPEN DISTAL CLAVICLE EXCISION, ACROMIOPLASTY;  Surgeon: Earlie Server, MD;  Location: [place];  Service: Orthopedics;  Laterality: Left;  . TRIGGER FINGER RELEASE Left 06/30/2017   Procedure: RELEASE TRIGGER FINGER/A-1 PULLEY LEFT THUMB;  Surgeon: Earlie Server, MD;  Location: [place];  Service: Orthopedics;  Laterality: Left;    There were no vitals filed for this  visit.      Subjective Assessment - 09/02/17 1224    Subjective Patient reported feeling pretty good today.    Patient Stated Goals get use of my left shoulder again without pain.   Currently in Pain? No/denies            [facility] PT Assessment - 09/02/17 0001      Assessment   Medical Diagnosis Open left RTC repair.   Onset Date/Surgical Date 06/30/17   Next MD Visit 08/24/17                     [facility] Adult PT Treatment/Exercise - 09/02/17 0001      Shoulder Exercises: Prone   Retraction Strengthening;Left;Weights  3x10   Retraction Weight (lbs) 3   Extension Strengthening;Left;Weights  3x10     Shoulder Exercises: Sidelying   External Rotation Strengthening;Left;Weights  3x10   External Rotation Weight (lbs) 2     Shoulder Exercises: Standing   Protraction Strengthening;Left;20 reps   Theraband Level (Shoulder Protraction) Level 1 (Yellow)   External Rotation Strengthening;Left;10 reps;20 reps   Theraband Level (Shoulder External Rotation) Level 1 (Yellow)   Internal Rotation Strengthening;Left;10 reps;20 reps   Theraband Level (Shoulder Internal Rotation) Level 1 (Yellow)   Flexion Strengthening;Left;Weights  3x10   Theraband Level (Shoulder Flexion) Other (comment)   Flexion Limitations with cane with 4# weight   Extension Strengthening;Left;20 reps;Theraband   Theraband Level (Shoulder Extension) Level 1 (Yellow)   Other Standing Exercises PNF diagnals with 2# ball on wall x  15 times, with large therapy ball standing scapular stabilization x 30 rotations both directions   Other Standing Exercises cane with 4# weight performing figure 8 pattern x 20.      Shoulder Exercises: Pulleys   Other Pulley Exercises wall ladder x 10      Shoulder Exercises: ROM/Strengthening   UBE (Upper Arm Bike) 90 RPM x  6 min   Wall Pushups 20 reps     Vasopneumatic   Number Minutes Vasopneumatic  15 minutes   Vasopnuematic Location  Shoulder   Vasopneumatic  Pressure Medium   Vasopneumatic Temperature  36                     PT Long Term Goals - 09/02/17 1227      PT LONG TERM GOAL #1   Title Independent with a HEP.   Period Weeks   Status Achieved     PT LONG TERM GOAL #2   Title Active left shoulder flexion to 145 degrees so the patient can easily reach overhead.   Baseline 155 as of 08/23/17   Period Weeks   Status Achieved     PT LONG TERM GOAL #3   Title Active ER to 70 degrees+ to allow for easily donning/doffing of apparel.   Baseline 90 deg in standing as of 08/23/17   Time 8   Period Weeks   Status Achieved     PT LONG TERM GOAL #4   Title Increase ROM so patient is able to reach behind back to put belt on, tuck in shirt and towel off after shower.   Baseline Goal revised from reaching to L3 to functional goal above. Patient able to reach to L4 as of 08/23/17.   Time 8   Period Weeks   Status Achieved     PT LONG TERM GOAL #5   Title Increase left shoulder strength to a solid 4+/5 to increase stability for performance of functional activities.   Baseline Left shoulder 4/4 grossly   Time 8   Period Weeks   Status On-going     PT LONG TERM GOAL #6   Title Perform ADL's with pain not > 2-3/10.   Time 8   Period Weeks   Status Achieved               Plan - 09/02/17 1225    Clinical Impression Statement Patient tolerating therapy well today. Pt reporting no pain at start and at of session. Pt still having intermittent pain at home which can be aggrivated with household activites. Pt making progress with left shoulder strengthening and Range. Continue with skilled PT.    Rehab Potential Excellent   Clinical Impairments Affecting Rehab Potential surgery 06/30/17    PT Frequency 2x / week   PT Duration 8 weeks   PT Treatment/Interventions ADLs/Self Care Home Management;Cryotherapy;Electrical Stimulation;Ultrasound;Patient/family education;Neuromuscular re-education;Therapeutic exercise;Therapeutic  activities;Manual techniques;Passive range of motion;Vasopneumatic Device   PT Next Visit Plan cont with POC    PT Home Exercise Plan isometrics; supine cane for flex/ER   MD to fax new order.   Consulted and Agree with Plan of Care Patient      Patient will benefit from skilled therapeutic intervention in order to improve the following deficits and impairments:  Pain, Decreased activity tolerance, Decreased range of motion, Decreased strength  Visit Diagnosis: Stiffness of left shoulder, not elsewhere classified  Muscle weakness (generalized)  Chronic left shoulder pain     Problem List Patient Active Problem  List   Diagnosis Date Noted  . Hypertension 02/22/2012  . High cholesterol 02/22/2012    Oretha Caprice, MPT 09/02/2017, 12:28 PM  [facility] [address] [place], [place], [address] Phone: [phone]   Fax:  [phone]  Name: [name] MRN: [id number] Date of Birth: 08-23-1956

## 2017-09-07 ENCOUNTER — Ambulatory Visit: Payer: BLUE CROSS/BLUE SHIELD | Admitting: Physical Therapy

## 2017-09-07 DIAGNOSIS — M25512 Pain in left shoulder: Secondary | ICD-10-CM

## 2017-09-07 DIAGNOSIS — G8929 Other chronic pain: Secondary | ICD-10-CM

## 2017-09-07 DIAGNOSIS — M6281 Muscle weakness (generalized): Secondary | ICD-10-CM

## 2017-09-07 DIAGNOSIS — M25612 Stiffness of left shoulder, not elsewhere classified: Secondary | ICD-10-CM | POA: Diagnosis not present

## 2017-09-07 NOTE — Therapy (Signed)
[facility] [place], [place], [address] Phone: [phone]   Fax:  [phone]  Physical Therapy Treatment  Patient Details  Name: [name] Date of Birth: [birth year] Referring Provider: Earlie Server MD.  Encounter Date: 09/07/2017      PT End of Session - 09/07/17 0944    Visit Number 18   Number of Visits 24   Date for PT Re-Evaluation 09/23/17   PT Start Time 0900   PT Stop Time 0953   PT Time Calculation (min) 53 min   Activity Tolerance Patient tolerated treatment well   Behavior During Therapy [facility] for tasks assessed/performed      Past Medical History:  Diagnosis Date  . Arthritis    shoulders and hands  . Diabetes mellitus without complication ([place])   . Enlarged prostate   . GERD (gastroesophageal reflux disease)   . Hyperlipidemia   . Hypertension   . Trigger thumb of left hand     Past Surgical History:  Procedure Laterality Date  . CARPAL TUNNEL RELEASE    . COLONOSCOPY  03/24/2012   Procedure: COLONOSCOPY;  Surgeon: Rogene Houston, MD;  [facility];  Service: Endoscopy;  Laterality: N/A;  100  . SHOULDER ACROMIOPLASTY Left 06/30/2017   Procedure: SHOULDER ACROMIOPLASTY;  Surgeon: Earlie Server, MD;  Location: [place];  Service: Orthopedics;  Laterality: Left;  . SHOULDER ARTHROSCOPY WITH OPEN ROTATOR CUFF REPAIR AND DISTAL CLAVICLE ACROMINECTOMY Left 06/30/2017   Procedure: LEFT SHOULDER ARTHROSCOPIC DEBRIDEMENT, WITH OPEN ROTATOR CUFF REPAIR, OPEN DISTAL CLAVICLE EXCISION, ACROMIOPLASTY;  Surgeon: Earlie Server, MD;  Location: [place];  Service: Orthopedics;  Laterality: Left;  . TRIGGER FINGER RELEASE Left 06/30/2017   Procedure: RELEASE TRIGGER FINGER/A-1 PULLEY LEFT THUMB;  Surgeon: Earlie Server, MD;  Location: [place];  Service: Orthopedics;  Laterality: Left;    There were no vitals filed for this  visit.      Subjective Assessment - 09/07/17 1035    Subjective I can't believe how well I'm doing.   Pain Score 2    Pain Location Shoulder   Pain Orientation Left   Pain Descriptors / Indicators Discomfort   Pain Type Surgical pain   Pain Onset More than a month ago                         [facility] Adult PT Treatment/Exercise - 09/07/17 0001      Exercises   Exercises Shoulder     Shoulder Exercises: Standing   Other Standing Exercises UE Ranger on wall x 5 minutes.   Other Standing Exercises RW4 with yellow theraband to fatigue; bicpes curls; tricep extensions and shoulder extension all to fatigue.     Shoulder Exercises: ROM/Strengthening   UBE (Upper Arm Bike) 90 RPM's x 8 minutes.     Modalities   Modalities Location manager Stimulation Location Left shoulder.   Electrical Stimulation Action IFC   Electrical Stimulation Parameters 80-150 Hz x 15 minutes.   Electrical Stimulation Goals Pain     Vasopneumatic   Number Minutes Vasopneumatic  15 minutes   Vasopnuematic Location  --  Left shoulder.   Vasopneumatic Pressure Medium                     PT Long Term Goals - 09/02/17 1227      PT  LONG TERM GOAL #1   Title Independent with a HEP.   Period Weeks   Status Achieved     PT LONG TERM GOAL #2   Title Active left shoulder flexion to 145 degrees so the patient can easily reach overhead.   Baseline 155 as of 08/23/17   Period Weeks   Status Achieved     PT LONG TERM GOAL #3   Title Active ER to 70 degrees+ to allow for easily donning/doffing of apparel.   Baseline 90 deg in standing as of 08/23/17   Time 8   Period Weeks   Status Achieved     PT LONG TERM GOAL #4   Title Increase ROM so patient is able to reach behind back to put belt on, tuck in shirt and towel off after shower.   Baseline Goal revised from reaching to L3 to functional goal above. Patient able to  reach to L4 as of 08/23/17.   Time 8   Period Weeks   Status Achieved     PT LONG TERM GOAL #5   Title Increase left shoulder strength to a solid 4+/5 to increase stability for performance of functional activities.   Baseline Left shoulder 4/4 grossly   Time 8   Period Weeks   Status On-going     PT LONG TERM GOAL #6   Title Perform ADL's with pain not > 2-3/10.   Time 8   Period Weeks   Status Achieved               Plan - 2017/09/08 1120    Clinical Impression Statement Patient doing very well with progression toward goals.      Patient will benefit from skilled therapeutic intervention in order to improve the following deficits and impairments:  Pain, Decreased activity tolerance, Decreased range of motion, Decreased strength  Visit Diagnosis: Muscle weakness (generalized)  Stiffness of left shoulder, not elsewhere classified  Chronic left shoulder pain       G-Codes - 09-08-17 0944    Functional Assessment Tool Used (Outpatient Only) FOTO...36% limitation....18th visit.      Problem List Patient Active Problem List   Diagnosis Date Noted  . Hypertension 02/22/2012  . High cholesterol 02/22/2012    Infiniti Hoefling, [place] MPT 08-Sep-2017, 11:21 AM  [facility] [address] [place], [place], [address] Phone: [phone]   Fax:  [phone]  Name: [name] MRN: [id number] Date of Birth: 1956/02/04

## 2017-09-13 ENCOUNTER — Encounter: Payer: Self-pay | Admitting: Physical Therapy

## 2017-09-13 ENCOUNTER — Ambulatory Visit: Payer: BLUE CROSS/BLUE SHIELD | Attending: Orthopedic Surgery | Admitting: Physical Therapy

## 2017-09-13 DIAGNOSIS — M25512 Pain in left shoulder: Secondary | ICD-10-CM | POA: Diagnosis present

## 2017-09-13 DIAGNOSIS — M25612 Stiffness of left shoulder, not elsewhere classified: Secondary | ICD-10-CM | POA: Diagnosis present

## 2017-09-13 DIAGNOSIS — G8929 Other chronic pain: Secondary | ICD-10-CM

## 2017-09-13 DIAGNOSIS — M6281 Muscle weakness (generalized): Secondary | ICD-10-CM

## 2017-09-13 NOTE — Therapy (Signed)
[facility] [place], [place], [address] Phone: [phone]   Fax:  [phone]  Physical Therapy Treatment  Patient Details  Name: [name] MRN: [id number] Date of Birth: 1956/09/22 Referring Provider: Earlie Server MD.   Encounter Date: 09/13/2017  PT End of Session - 09/13/17 1034    Visit Number  19    Number of Visits  24    Date for PT Re-Evaluation  09/23/17    PT Start Time  0948    PT Stop Time  1038    PT Time Calculation (min)  50 min    Activity Tolerance  Patient tolerated treatment well    Behavior During Therapy  [facility] for tasks assessed/performed       Past Medical History:  Diagnosis Date  . Arthritis    shoulders and hands  . Diabetes mellitus without complication ([place])   . Enlarged prostate   . GERD (gastroesophageal reflux disease)   . Hyperlipidemia   . Hypertension   . Trigger thumb of left hand     Past Surgical History:  Procedure Laterality Date  . CARPAL TUNNEL RELEASE      There were no vitals filed for this visit.  Subjective Assessment - 09/13/17 1015    Subjective  I had trigger finger release to left hand last Wednesday.  Hand still ACE wrapped.    Currently in Pain?  Yes    Pain Score  2     Pain Location  Shoulder    Pain Orientation  Left    Pain Descriptors / Indicators  Sore    Pain Type  Surgical pain    Pain Onset  More than a month ago                      [facility] Adult PT Treatment/Exercise - 09/13/17 0001      Exercises   Exercises  Shoulder      Shoulder Exercises: Standing   Other Standing Exercises  2# left shoulder flexion to 80 degrees with palm down and full can with 2# through a full range of motion 3 sets  of 15 reps; left ER with shoulder abducted to 45 degrees 2# 3 x 15 reps; 3# bicep curls and 3# tricep kick backs 3# 3 x 15 reps.      Shoulder Exercises: Pulleys   Other Pulley Exercises  UE Ranger on wall with 2# x 5 minutes  into flexion, CW and CCW.      Shoulder Exercises: ROM/Strengthening   UBE (Upper Arm Bike)  90 RPM's x 10 minutes.      Acupuncturist Location  Left shoulder.    Electrical Stimulation Action  IFC    Electrical Stimulation Parameters  80-150 Hz x 15 minutes.    Electrical Stimulation Goals  Pain      Vasopneumatic   Number Minutes Vasopneumatic   15 minutes    Vasopnuematic Location   -- Left shoulder.   Left shoulder.   Vasopneumatic Pressure  Medium                  PT Long Term Goals - 09/02/17 1227      PT LONG TERM GOAL #1   Title  Independent with a HEP.    Period  Weeks    Status  Achieved      PT LONG TERM GOAL #2   Title  Active left shoulder flexion  to 145 degrees so the patient can easily reach overhead.    Baseline  155 as of 08/23/17    Period  Weeks    Status  Achieved      PT LONG TERM GOAL #3   Title  Active ER to 70 degrees+ to allow for easily donning/doffing of apparel.    Baseline  90 deg in standing as of 08/23/17    Time  8    Period  Weeks    Status  Achieved      PT LONG TERM GOAL #4   Title  Increase ROM so patient is able to reach behind back to put belt on, tuck in shirt and towel off after shower.    Baseline  Goal revised from reaching to L3 to functional goal above. Patient able to reach to L4 as of 08/23/17.    Time  8    Period  Weeks    Status  Achieved      PT LONG TERM GOAL #5   Title  Increase left shoulder strength to a solid 4+/5 to increase stability for performance of functional activities.    Baseline  Left shoulder 4/4 grossly    Time  8    Period  Weeks    Status  On-going      PT LONG TERM GOAL #6   Title  Perform ADL's with pain not > 2-3/10.    Time  8    Period  Weeks    Status  Achieved            Plan - 09/13/17 1038    Clinical Impression Statement  Excellent job today in spite of the fact he had a trigger finger release procedure done last week.  The  patient is making excellentr progress toward goals.    Rehab Potential  Excellent    PT Treatment/Interventions  ADLs/Self Care Home Management;Cryotherapy;Electrical Stimulation;Ultrasound;Patient/family education;Neuromuscular re-education;Therapeutic exercise;Therapeutic activities;Manual techniques;Passive range of motion;Vasopneumatic Device    PT Home Exercise Plan  isometrics; supine cane for flex/ER   MD to fax new order.       Patient will benefit from skilled therapeutic intervention in order to improve the following deficits and impairments:  Pain, Decreased activity tolerance, Decreased range of motion, Decreased strength  Visit Diagnosis: Muscle weakness (generalized)  Stiffness of left shoulder, not elsewhere classified  Chronic left shoulder pain     Problem List Patient Active Problem List   Diagnosis Date Noted  . Hypertension 02/22/2012  . High cholesterol 02/22/2012    Jocelin Schuelke, [place] MPT 09/13/2017, 10:40 AM  [facility] [address] [place], [place], [address] Phone: [phone]   Fax:  [phone]  Name: [name] MRN: [id number] Date of Birth: [birth year]

## 2017-09-16 ENCOUNTER — Encounter: Payer: Self-pay | Admitting: Physical Therapy

## 2017-09-16 ENCOUNTER — Ambulatory Visit: Payer: BLUE CROSS/BLUE SHIELD | Admitting: Physical Therapy

## 2017-09-16 DIAGNOSIS — M25512 Pain in left shoulder: Secondary | ICD-10-CM

## 2017-09-16 DIAGNOSIS — G8929 Other chronic pain: Secondary | ICD-10-CM

## 2017-09-16 DIAGNOSIS — M6281 Muscle weakness (generalized): Secondary | ICD-10-CM

## 2017-09-16 DIAGNOSIS — M25612 Stiffness of left shoulder, not elsewhere classified: Secondary | ICD-10-CM

## 2017-09-16 NOTE — Therapy (Signed)
[facility] [place], [place], [address] Phone: [phone]   Fax:  [phone]  Physical Therapy Treatment  Patient Details  Name: [name] MRN: [id number] Date of Birth: 06/12/1956 Referring Provider: Earlie Server MD.   Encounter Date: 09/16/2017  PT End of Session - 09/16/17 1042    Visit Number  20    Number of Visits  24    Date for PT Re-Evaluation  09/23/17    PT Start Time  0946    PT Stop Time  1041    PT Time Calculation (min)  55 min    Activity Tolerance  Patient tolerated treatment well    Behavior During Therapy  [facility] for tasks assessed/performed       Past Medical History:  Diagnosis Date  . Arthritis    shoulders and hands  . Diabetes mellitus without complication ([place])   . Enlarged prostate   . GERD (gastroesophageal reflux disease)   . Hyperlipidemia   . Hypertension   . Trigger thumb of left hand     Past Surgical History:  Procedure Laterality Date  . CARPAL TUNNEL RELEASE      There were no vitals filed for this visit.  Subjective Assessment - 09/16/17 1028    Subjective  I go to the doctor Monday.    Patient Stated Goals  get use of my left shoulder again without pain.    Pain Score  2     Pain Location  Shoulder    Pain Orientation  Left    Pain Descriptors / Indicators  Sore    Pain Onset  More than a month ago                      [facility] Adult PT Treatment/Exercise - 09/16/17 0001      Exercises   Exercises  Shoulder      Shoulder Exercises: Standing   Other Standing Exercises  2# full can and flexion with palm down to 80 degrees; ER with 2# at 45 degrees abduction; 4# bicep curls and 3# shoulder extension all performed 3 sets of 15 reps.    Other Standing Exercises  UE ranger on wall x 5 minutes with 3# into flexion, CCW and CW.      Shoulder Exercises: ROM/Strengthening   UBE (Upper Arm Bike)  90 RPM's x 10 minutes.      Vasopneumatic   Number  Minutes Vasopneumatic   20 minutes    Vasopnuematic Location   -- Left shoulder.   Left shoulder.   Vasopneumatic Pressure  Medium                  PT Long Term Goals - 09/02/17 1227      PT LONG TERM GOAL #1   Title  Independent with a HEP.    Period  Weeks    Status  Achieved      PT LONG TERM GOAL #2   Title  Active left shoulder flexion to 145 degrees so the patient can easily reach overhead.    Baseline  155 as of 08/23/17    Period  Weeks    Status  Achieved      PT LONG TERM GOAL #3   Title  Active ER to 70 degrees+ to allow for easily donning/doffing of apparel.    Baseline  90 deg in standing as of 08/23/17    Time  8    Period  Weeks    Status  Achieved      PT LONG TERM GOAL #4   Title  Increase ROM so patient is able to reach behind back to put belt on, tuck in shirt and towel off after shower.    Baseline  Goal revised from reaching to L3 to functional goal above. Patient able to reach to L4 as of 08/23/17.    Time  8    Period  Weeks    Status  Achieved      PT LONG TERM GOAL #5   Title  Increase left shoulder strength to a solid 4+/5 to increase stability for performance of functional activities.    Baseline  Left shoulder 4/4 grossly    Time  8    Period  Weeks    Status  On-going      PT LONG TERM GOAL #6   Title  Perform ADL's with pain not > 2-3/10.    Time  8    Period  Weeks    Status  Achieved            Plan - 09/16/17 1043    Clinical Impression Statement  Patient making excellent progress toward goals.       Patient will benefit from skilled therapeutic intervention in order to improve the following deficits and impairments:  Pain, Decreased activity tolerance, Decreased range of motion, Decreased strength  Visit Diagnosis: Muscle weakness (generalized)  Stiffness of left shoulder, not elsewhere classified  Chronic left shoulder pain     Problem List Patient Active Problem List   Diagnosis Date Noted  .  Hypertension 02/22/2012  . High cholesterol 02/22/2012    Rionna Feltes, [place] MPT 09/16/2017, 10:46 AM  [facility] [address] [place], [place], [address] Phone: [phone]   Fax:  [phone]  Name: [name] MRN: [id number] Date of Birth: [birth year]

## 2017-09-21 ENCOUNTER — Ambulatory Visit: Payer: BLUE CROSS/BLUE SHIELD | Admitting: *Deleted

## 2017-09-21 DIAGNOSIS — M6281 Muscle weakness (generalized): Secondary | ICD-10-CM | POA: Diagnosis not present

## 2017-09-21 DIAGNOSIS — M25612 Stiffness of left shoulder, not elsewhere classified: Secondary | ICD-10-CM

## 2017-09-21 DIAGNOSIS — M25512 Pain in left shoulder: Secondary | ICD-10-CM

## 2017-09-21 DIAGNOSIS — G8929 Other chronic pain: Secondary | ICD-10-CM

## 2017-09-21 NOTE — Therapy (Signed)
[facility] [place], [place], [address] Phone: [phone]   Fax:  [phone]  Physical Therapy Treatment  Patient Details  Name: [name] MRN: [id number] Date of Birth: 1956/09/24 Referring Provider: Earlie Server MD.   Encounter Date: 09/21/2017  PT End of Session - 09/21/17 1125    Visit Number  21    Number of Visits  24    Date for PT Re-Evaluation  09/23/17    PT Start Time  1117    PT Stop Time  1205    PT Time Calculation (min)  48 min       Past Medical History:  Diagnosis Date  . Arthritis    shoulders and hands  . Diabetes mellitus without complication ([place])   . Enlarged prostate   . GERD (gastroesophageal reflux disease)   . Hyperlipidemia   . Hypertension   . Trigger thumb of left hand     Past Surgical History:  Procedure Laterality Date  . CARPAL TUNNEL RELEASE      There were no vitals filed for this visit.  Subjective Assessment - 09/21/17 1123    Subjective  MD has released me for my shldr and my hand. I will finish out my PT visits and then DC    Patient Stated Goals  get use of my left shoulder again without pain.    Currently in Pain?  Yes    Pain Score  2     Pain Orientation  Left    Pain Descriptors / Indicators  Sore    Pain Type  Surgical pain    Pain Onset  More than a month ago    Pain Frequency  Intermittent                      [facility] Adult PT Treatment/Exercise - 09/21/17 0001      Exercises   Exercises  Shoulder;Elbow      Elbow Exercises   Elbow Flexion  Strengthening;Left    Bar Weights/Barbell (Elbow Flexion)  4 lbs    Elbow Extension  Strengthening;Left    Theraband Level (Elbow Extension)  -- XTS pink 3x15      Shoulder Exercises: Prone   Retraction  Strengthening    Retraction Weight (lbs)  4# 3x15    Extension  Strengthening;Left    Extension Weight (lbs)  4# 3x15      Shoulder Exercises: Standing   Other Standing Exercises  2#  full can and flexion 3 xx 15; abduction; 4# bicep curls 3 sets of 15 reps.    Other Standing Exercises  wall push ups  3x15      Shoulder Exercises: ROM/Strengthening   UBE (Upper Arm Bike)  90 RPM's x 10 minutes.      Vasopneumatic   Number Minutes Vasopneumatic   15 minutes    Vasopnuematic Location   -- Left shoulder.    Vasopneumatic Pressure  Medium    Vasopneumatic Temperature   36                  PT Long Term Goals - 09/02/17 1227      PT LONG TERM GOAL #1   Title  Independent with a HEP.    Period  Weeks    Status  Achieved      PT LONG TERM GOAL #2   Title  Active left shoulder flexion to 145 degrees so the patient can easily reach overhead.  Baseline  155 as of 08/23/17    Period  Weeks    Status  Achieved      PT LONG TERM GOAL #3   Title  Active ER to 70 degrees+ to allow for easily donning/doffing of apparel.    Baseline  90 deg in standing as of 08/23/17    Time  8    Period  Weeks    Status  Achieved      PT LONG TERM GOAL #4   Title  Increase ROM so patient is able to reach behind back to put belt on, tuck in shirt and towel off after shower.    Baseline  Goal revised from reaching to L3 to functional goal above. Patient able to reach to L4 as of 08/23/17.    Time  8    Period  Weeks    Status  Achieved      PT LONG TERM GOAL #5   Title  Increase left shoulder strength to a solid 4+/5 to increase stability for performance of functional activities.    Baseline  Left shoulder 4/4 grossly    Time  8    Period  Weeks    Status  On-going      PT LONG TERM GOAL #6   Title  Perform ADL's with pain not > 2-3/10.    Time  8    Period  Weeks    Status  Achieved            Plan - 09/21/17 1119    Clinical Impression Statement  Pt arrived today doing very well after MD F/U.  Md has released Pt from his care at this time and will finish his PT visits and then DC. Rx focused on strengthening of LT shldr    Clinical Presentation  Stable     Clinical Decision Making  Low    Rehab Potential  Excellent    Clinical Impairments Affecting Rehab Potential  surgery 06/30/17     PT Frequency  2x / week    PT Duration  8 weeks    PT Treatment/Interventions  ADLs/Self Care Home Management;Cryotherapy;Electrical Stimulation;Ultrasound;Patient/family education;Neuromuscular re-education;Therapeutic exercise;Therapeutic activities;Manual techniques;Passive range of motion;Vasopneumatic Device    PT Next Visit Plan  cont with POC     PT Home Exercise Plan  isometrics; supine cane for flex/ER   MD to fax new order.    Consulted and Agree with Plan of Care  Patient       Patient will benefit from skilled therapeutic intervention in order to improve the following deficits and impairments:  Pain, Decreased activity tolerance, Decreased range of motion, Decreased strength  Visit Diagnosis: Muscle weakness (generalized)  Stiffness of left shoulder, not elsewhere classified  Chronic left shoulder pain     Problem List Patient Active Problem List   Diagnosis Date Noted  . Hypertension 02/22/2012  . High cholesterol 02/22/2012    Jessilynn Taft,CHRIS, PTA 09/21/2017, 12:05 PM  [facility] [place], [place], [address] Phone: [phone]   Fax:  [phone]  Name: [name] MRN: [id number] Date of Birth: [birth year]

## 2017-09-23 ENCOUNTER — Ambulatory Visit: Payer: BLUE CROSS/BLUE SHIELD | Admitting: Physical Therapy

## 2017-09-23 ENCOUNTER — Encounter: Payer: Self-pay | Admitting: Physical Therapy

## 2017-09-23 DIAGNOSIS — M6281 Muscle weakness (generalized): Secondary | ICD-10-CM

## 2017-09-23 DIAGNOSIS — M25512 Pain in left shoulder: Secondary | ICD-10-CM

## 2017-09-23 DIAGNOSIS — M25612 Stiffness of left shoulder, not elsewhere classified: Secondary | ICD-10-CM

## 2017-09-23 DIAGNOSIS — G8929 Other chronic pain: Secondary | ICD-10-CM

## 2017-09-23 NOTE — Therapy (Addendum)
[facility] [place], [place], [address] Phone: [phone]   Fax:  [phone]  Physical Therapy Treatment  Patient Details  Name: [name] MRN: [id number] Date of Birth: 1956/07/28 Referring Provider: Earlie Server MD.   Encounter Date: 09/23/2017  PT End of Session - 09/23/17 0936    Visit Number  22    Number of Visits  24    Date for PT Re-Evaluation  09/23/17    PT Start Time  0900    PT Stop Time  0955    PT Time Calculation (min)  55 min    Activity Tolerance  Patient tolerated treatment well    Behavior During Therapy  [facility] for tasks assessed/performed       Past Medical History:  Diagnosis Date  . Arthritis    shoulders and hands  . Diabetes mellitus without complication ([place])   . Enlarged prostate   . GERD (gastroesophageal reflux disease)   . Hyperlipidemia   . Hypertension   . Trigger thumb of left hand     Past Surgical History:  Procedure Laterality Date  . CARPAL TUNNEL RELEASE    . COLONOSCOPY  03/24/2012   Procedure: COLONOSCOPY;  Surgeon: Rogene Houston, MD;  [facility];  Service: Endoscopy;  Laterality: N/A;  100  . SHOULDER ACROMIOPLASTY Left 06/30/2017   Procedure: SHOULDER ACROMIOPLASTY;  Surgeon: Earlie Server, MD;  Location: [facility];  Service: Orthopedics;  Laterality: Left;  . SHOULDER ARTHROSCOPY WITH OPEN ROTATOR CUFF REPAIR AND DISTAL CLAVICLE ACROMINECTOMY Left 06/30/2017   Procedure: LEFT SHOULDER ARTHROSCOPIC DEBRIDEMENT, WITH OPEN ROTATOR CUFF REPAIR, OPEN DISTAL CLAVICLE EXCISION, ACROMIOPLASTY;  Surgeon: Earlie Server, MD;  Location: [facility];  Service: Orthopedics;  Laterality: Left;  . TRIGGER FINGER RELEASE Left 06/30/2017   Procedure: RELEASE TRIGGER FINGER/A-1 PULLEY LEFT THUMB;  Surgeon: Earlie Server, MD;  Location: [facility];  Service: Orthopedics;  Laterality: Left;    There were no vitals filed  for this visit.  Subjective Assessment - 09/23/17 0903    Subjective  Patient reported doing well overall    Patient Stated Goals  get use of my left shoulder again without pain.    Currently in Pain?  Yes    Pain Score  2     Pain Location  Shoulder    Pain Orientation  Left    Pain Descriptors / Indicators  Sore    Pain Type  Surgical pain    Pain Onset  More than a month ago    Pain Frequency  Intermittent    Aggravating Factors   certain movements    Pain Relieving Factors  at rest                      [facility] Adult PT Treatment/Exercise - 09/23/17 0001      Elbow Exercises   Elbow Flexion  Strengthening;Left 3x15    Bar Weights/Barbell (Elbow Flexion)  4 lbs      Shoulder Exercises: Prone   Retraction  Strengthening;Left;Weights    Retraction Weight (lbs)  4# 3x15    Extension  Strengthening;Left;Weights    Extension Weight (lbs)  4# 3x15      Shoulder Exercises: Standing   Protraction  Strengthening;Left;Theraband 2x20    Theraband Level (Shoulder Protraction)  Level 2 (Red)    External Rotation  Strengthening;Left;Theraband 2x20    Theraband Level (Shoulder External Rotation)  Level 2 (Red)  Internal Rotation  Strengthening;Left;Theraband 2x20    Theraband Level (Shoulder Internal Rotation)  Level 2 (Red)    Extension  Strengthening;Left;Theraband 2x20    Theraband Level (Shoulder Extension)  Level 2 (Red)    Other Standing Exercises  2# with flexion/scaption/abd 3x15 each    Other Standing Exercises  wall push ups  3x15      Shoulder Exercises: ROM/Strengthening   UBE (Upper Arm Bike)  90 RPM's x 6 minutes.    Other ROM/Strengthening Exercises  pink XTS for scapular retractions x30      Shoulder Exercises: Power Warden/ranger Exercises  14# box lift floor to waist level practiced technique x1    Other Power [employer] Exercises  cone stacking waist to overhead with 2# and without x fatigue each set      Vasopneumatic   Number Minutes  Vasopneumatic   15 minutes    Vasopnuematic Location   Shoulder    Vasopneumatic Pressure  Medium                  PT Long Term Goals - 09/02/17 1227      PT LONG TERM GOAL #1   Title  Independent with a HEP.    Period  Weeks    Status  Achieved      PT LONG TERM GOAL #2   Title  Active left shoulder flexion to 145 degrees so the patient can easily reach overhead.    Baseline  155 as of 08/23/17    Period  Weeks    Status  Achieved      PT LONG TERM GOAL #3   Title  Active ER to 70 degrees+ to allow for easily donning/doffing of apparel.    Baseline  90 deg in standing as of 08/23/17    Time  8    Period  Weeks    Status  Achieved      PT LONG TERM GOAL #4   Title  Increase ROM so patient is able to reach behind back to put belt on, tuck in shirt and towel off after shower.    Baseline  Goal revised from reaching to L3 to functional goal above. Patient able to reach to L4 as of 08/23/17.    Time  8    Period  Weeks    Status  Achieved      PT LONG TERM GOAL #5   Title  Increase left shoulder strength to a solid 4+/5 to increase stability for performance of functional activities.    Baseline  Left shoulder 4/4 grossly    Time  8    Period  Weeks    Status  On-going      PT LONG TERM GOAL #6   Title  Perform ADL's with pain not > 2-3/10.    Time  8    Period  Weeks    Status  Achieved            Plan - 09/23/17 0944    Clinical Impression Statement  Patient tolerated treatment well today and progressing with all activities. Patient reported mimimal discomfort overall just mainly fatigue. Today focused on strengthening left shoulder and stabilizing exercises and work similation exercises (gentle lifting technique and light overhead). Patient progressing toward remaining strength goal.     Rehab Potential  Excellent    Clinical Impairments Affecting Rehab Potential  surgery 06/30/17     PT Frequency  2x / week  PT Duration  8 weeks    PT  Treatment/Interventions  ADLs/Self Care Home Management;Cryotherapy;Electrical Stimulation;Ultrasound;Patient/family education;Neuromuscular re-education;Therapeutic exercise;Therapeutic activities;Manual techniques;Passive range of motion;Vasopneumatic Device    PT Next Visit Plan  cont with POC DC after 2 visits    Consulted and Agree with Plan of Care  Patient       Patient will benefit from skilled therapeutic intervention in order to improve the following deficits and impairments:  Pain, Decreased activity tolerance, Decreased range of motion, Decreased strength  Visit Diagnosis: Muscle weakness (generalized)  Stiffness of left shoulder, not elsewhere classified  Chronic left shoulder pain     Problem List Patient Active Problem List   Diagnosis Date Noted  . Hypertension 02/22/2012  . High cholesterol 02/22/2012    Jacorey Donaway P, PTA 09/23/2017, 10:03 AM  [facility] [place], [place], [address] Phone: [phone]   Fax:  [phone]  Name: [name] MRN: [id number] Date of Birth: [birth year]  PHYSICAL THERAPY DISCHARGE SUMMARY  Visits from Start of Care: 22.  Current functional level related to goals / functional outcomes: See above.   Remaining deficits: All but LTG #5 met.   Education / Equipment: HEP. Plan: Patient agrees to discharge.  Patient goals were not met. Patient is being discharged due to being pleased with the current functional level.  ?????        Mali Applegate MPT

## 2017-09-27 ENCOUNTER — Ambulatory Visit: Payer: BLUE CROSS/BLUE SHIELD | Admitting: Physical Therapy

## 2017-10-13 ENCOUNTER — Ambulatory Visit (HOSPITAL_COMMUNITY)
Admission: RE | Admit: 2017-10-13 | Discharge: 2017-10-13 | Disposition: A | Discharge status: Home / Self Care | Payer: BLUE CROSS/BLUE SHIELD | Source: Ambulatory Visit | Attending: Family Medicine | Admitting: Family Medicine

## 2017-10-13 ENCOUNTER — Other Ambulatory Visit (HOSPITAL_COMMUNITY): Payer: Self-pay | Admitting: Family Medicine

## 2017-10-13 DIAGNOSIS — R059 Cough, unspecified: Secondary | ICD-10-CM

## 2017-10-13 DIAGNOSIS — R05 Cough: Secondary | ICD-10-CM

## 2017-10-13 DIAGNOSIS — R918 Other nonspecific abnormal finding of lung field: Secondary | ICD-10-CM | POA: Insufficient documentation

## 2019-05-04 ENCOUNTER — Encounter (INDEPENDENT_AMBULATORY_CARE_PROVIDER_SITE_OTHER): Payer: Self-pay | Admitting: *Deleted

## 2019-05-04 ENCOUNTER — Encounter (INDEPENDENT_AMBULATORY_CARE_PROVIDER_SITE_OTHER): Payer: Self-pay | Admitting: Internal Medicine

## 2019-05-04 ENCOUNTER — Telehealth (INDEPENDENT_AMBULATORY_CARE_PROVIDER_SITE_OTHER): Payer: Self-pay | Admitting: *Deleted

## 2019-05-04 ENCOUNTER — Other Ambulatory Visit: Payer: Self-pay

## 2019-05-04 ENCOUNTER — Ambulatory Visit (INDEPENDENT_AMBULATORY_CARE_PROVIDER_SITE_OTHER): Payer: Managed Care, Other (non HMO) | Admitting: Internal Medicine

## 2019-05-04 VITALS — BP 130/74 | HR 76 | Temp 98.2°F | Ht 67.0 in | Wt 188.4 lb

## 2019-05-04 DIAGNOSIS — Z860100 Personal history of colon polyps, unspecified: Secondary | ICD-10-CM | POA: Insufficient documentation

## 2019-05-04 DIAGNOSIS — Z800 Family history of malignant neoplasm of digestive organs: Secondary | ICD-10-CM

## 2019-05-04 MED ORDER — SUPREP BOWEL PREP KIT 17.5-3.13-1.6 GM/177ML PO SOLN: 1.0000 | Freq: Once | ORAL | 0 refills | Status: AC

## 2019-05-04 NOTE — Patient Instructions (Signed)
The risks of bleeding, perforation and infection were reviewed with patient.  

## 2019-05-04 NOTE — Telephone Encounter (Signed)
Patient needs suprep 

## 2019-05-04 NOTE — Progress Notes (Signed)
Subjective:    Patient ID: [name], male    DOB: July 07, 1956, 63 y.o.   MRN: [id number]  HPI Referred by Dr. Edrick Oh for colon polyps. Last colonoscopy in May of 2013 Hx of colon polyps and family hx of colon carcinoma (Mother and aunt) revealed:  Impression:  Examination performed to cecum. Three small diverticula as above. Three tiny polyps ablated via cold biopsy from sigmoid colon and submitted together. External hemorrhoids. Biopsy:  3 tiny polyps removed are hyperplastic.  He is doing good. No GI complaints. BMs move daily. No melena or BRRB Appetite is good. No weight loss Sometimes in the morning when he eat solids food, his stomach becomes upset. If he eats sweets, no problems.   Hx of diabetes x 2 1/2 years.  Family hx of colon cancer in mother and aunt.      04/20/2019 H and H 14.6 and 43.0  Review of Systems     Past Medical History:  Diagnosis Date  . Arthritis    shoulders and hands  . Diabetes mellitus without complication ([place])   . Enlarged prostate   . GERD (gastroesophageal reflux disease)   . Hyperlipidemia   . Hypertension   . Trigger thumb of left hand     Past Surgical History:  Procedure Laterality Date  . CARPAL TUNNEL RELEASE    . COLONOSCOPY  03/24/2012   Procedure: COLONOSCOPY;  Surgeon: Rogene Houston, MD;  [facility];  Service: Endoscopy;  Laterality: N/A;  100  . SHOULDER ACROMIOPLASTY Left 06/30/2017   Procedure: SHOULDER ACROMIOPLASTY;  Surgeon: Earlie Server, MD;  Location: [facility];  Service: Orthopedics;  Laterality: Left;  . SHOULDER ARTHROSCOPY WITH OPEN ROTATOR CUFF REPAIR AND DISTAL CLAVICLE ACROMINECTOMY Left 06/30/2017   Procedure: LEFT SHOULDER ARTHROSCOPIC DEBRIDEMENT, WITH OPEN ROTATOR CUFF REPAIR, OPEN DISTAL CLAVICLE EXCISION, ACROMIOPLASTY;  Surgeon: Earlie Server, MD;  Location: [facility];  Service: Orthopedics;  Laterality: Left;  . TRIGGER FINGER RELEASE Left  06/30/2017   Procedure: RELEASE TRIGGER FINGER/A-1 PULLEY LEFT THUMB;  Surgeon: Earlie Server, MD;  Location: [facility];  Service: Orthopedics;  Laterality: Left;    Allergies  Allergen Reactions  . Cozaar     Made head tingle and heart race  . Lisinopril Other (See Comments)    cough    Current Outpatient Medications on File Prior to Visit  Medication Sig Dispense Refill  . amLODipine (NORVASC) 10 MG tablet Take 10 mg by mouth daily.    . celecoxib (CELEBREX) 200 MG capsule Take 200 mg by mouth daily.     . hydrochlorothiazide (MICROZIDE) 12.5 MG capsule Take 12.5 mg by mouth daily.    . metFORMIN (GLUCOPHAGE) 500 MG tablet Take 500 mg by mouth 2 (two) times daily with a meal.    . pravastatin (PRAVACHOL) 40 MG tablet Take 40 mg by mouth daily.    Marland Kitchen aspirin 81 MG tablet Take 81 mg by mouth daily.    . fexofenadine (ALLEGRA) 30 MG tablet Take 30 mg by mouth 2 (two) times daily as needed.    . finasteride (PROSCAR) 5 MG tablet Take 5 mg by mouth daily.    . methocarbamol (ROBAXIN) 500 MG tablet Take 1 tablet (500 mg total) by mouth every 6 (six) hours as needed for muscle spasms. (Patient not taking: Reported on 05/04/2019) 60 tablet 0  . Multiple Vitamin (MULTIVITAMIN WITH MINERALS) TABS tablet Take 1 tablet by mouth daily.    Marland Kitchen  Omega-3 Fatty Acids (FISH OIL) 1000 MG CAPS Take by mouth.    . traMADol (ULTRAM) 50 MG tablet Take by mouth every 6 (six) hours as needed.     No current facility-administered medications on file prior to visit.      Objective:   Physical Exam Blood pressure 130/74, pulse 76, temperature 98.2 F (36.8 C), height 5\' 7"  (1.702 m), weight 188 lb 6.4 oz (85.5 kg). Alert and oriented. Skin warm and dry. Oral mucosa is moist.   . Sclera anicteric, conjunctivae is pink. Thyroid not enlarged. No cervical lymphadenopathy. Lungs clear. Heart regular rate and rhythm.  Abdomen is soft. Bowel sounds are positive. No hepatomegaly. No abdominal masses  felt. No tenderness.  No edema to lower extremities.         Assessment & Plan:  Family hx of colon cancer. Hx of colon polyps. Need surveillance colonoscopy. The risks of bleeding, perforation and infection were reviewed with patient.

## 2019-06-30 ENCOUNTER — Other Ambulatory Visit (HOSPITAL_COMMUNITY)
Admission: RE | Admit: 2019-06-30 | Discharge: 2019-06-30 | Disposition: A | Discharge status: Home / Self Care | Payer: Managed Care, Other (non HMO) | Source: Ambulatory Visit | Attending: Internal Medicine | Admitting: Internal Medicine

## 2019-06-30 ENCOUNTER — Other Ambulatory Visit: Payer: Self-pay

## 2019-06-30 DIAGNOSIS — Z01812 Encounter for preprocedural laboratory examination: Secondary | ICD-10-CM | POA: Insufficient documentation

## 2019-06-30 DIAGNOSIS — Z20828 Contact with and (suspected) exposure to other viral communicable diseases: Secondary | ICD-10-CM | POA: Diagnosis not present

## 2019-06-30 LAB — SARS CORONAVIRUS 2 (TAT 6-24 HRS): SARS Coronavirus 2: NEGATIVE

## 2019-07-05 ENCOUNTER — Other Ambulatory Visit: Payer: Self-pay

## 2019-07-05 ENCOUNTER — Encounter (HOSPITAL_COMMUNITY): Payer: Self-pay | Admitting: *Deleted

## 2019-07-05 ENCOUNTER — Encounter (HOSPITAL_COMMUNITY)
Admission: RE | Disposition: A | Discharge status: Home / Self Care | Payer: Self-pay | Source: Home / Self Care | Attending: Internal Medicine

## 2019-07-05 ENCOUNTER — Ambulatory Visit (HOSPITAL_COMMUNITY)
Admission: RE | Admit: 2019-07-05 | Discharge: 2019-07-05 | Disposition: A | Discharge status: Home / Self Care | Payer: Managed Care, Other (non HMO) | Attending: Internal Medicine | Admitting: Internal Medicine

## 2019-07-05 DIAGNOSIS — N400 Enlarged prostate without lower urinary tract symptoms: Secondary | ICD-10-CM | POA: Diagnosis not present

## 2019-07-05 DIAGNOSIS — M1990 Unspecified osteoarthritis, unspecified site: Secondary | ICD-10-CM | POA: Insufficient documentation

## 2019-07-05 DIAGNOSIS — Z79899 Other long term (current) drug therapy: Secondary | ICD-10-CM | POA: Insufficient documentation

## 2019-07-05 DIAGNOSIS — Z8719 Personal history of other diseases of the digestive system: Secondary | ICD-10-CM | POA: Insufficient documentation

## 2019-07-05 DIAGNOSIS — D123 Benign neoplasm of transverse colon: Secondary | ICD-10-CM

## 2019-07-05 DIAGNOSIS — K219 Gastro-esophageal reflux disease without esophagitis: Secondary | ICD-10-CM | POA: Diagnosis not present

## 2019-07-05 DIAGNOSIS — E119 Type 2 diabetes mellitus without complications: Secondary | ICD-10-CM | POA: Insufficient documentation

## 2019-07-05 DIAGNOSIS — Z87891 Personal history of nicotine dependence: Secondary | ICD-10-CM | POA: Insufficient documentation

## 2019-07-05 DIAGNOSIS — Z7984 Long term (current) use of oral hypoglycemic drugs: Secondary | ICD-10-CM | POA: Insufficient documentation

## 2019-07-05 DIAGNOSIS — I10 Essential (primary) hypertension: Secondary | ICD-10-CM | POA: Insufficient documentation

## 2019-07-05 DIAGNOSIS — K644 Residual hemorrhoidal skin tags: Secondary | ICD-10-CM | POA: Insufficient documentation

## 2019-07-05 DIAGNOSIS — Z860100 Personal history of colon polyps, unspecified: Secondary | ICD-10-CM | POA: Insufficient documentation

## 2019-07-05 DIAGNOSIS — Z800 Family history of malignant neoplasm of digestive organs: Secondary | ICD-10-CM | POA: Diagnosis not present

## 2019-07-05 DIAGNOSIS — Z1211 Encounter for screening for malignant neoplasm of colon: Secondary | ICD-10-CM | POA: Diagnosis not present

## 2019-07-05 DIAGNOSIS — E785 Hyperlipidemia, unspecified: Secondary | ICD-10-CM | POA: Insufficient documentation

## 2019-07-05 HISTORY — PX: COLONOSCOPY: SHX5424

## 2019-07-05 HISTORY — PX: POLYPECTOMY: SHX5525

## 2019-07-05 LAB — GLUCOSE, CAPILLARY: Glucose-Capillary: 98 mg/dL (ref 70–99)

## 2019-07-05 SURGERY — COLONOSCOPY
Anesthesia: Moderate Sedation

## 2019-07-05 MED ORDER — MIDAZOLAM HCL 5 MG/5ML IJ SOLN
INTRAMUSCULAR | Status: DC | PRN
  Administered 2019-07-05 (×2): 1 mg via INTRAVENOUS
  Administered 2019-07-05: 2 mg via INTRAVENOUS
  Administered 2019-07-05: 1 mg via INTRAVENOUS
  Administered 2019-07-05: 2 mg via INTRAVENOUS

## 2019-07-05 MED ORDER — MEPERIDINE HCL 50 MG/ML IJ SOLN
INTRAMUSCULAR | Status: AC
  Filled 2019-07-05: qty 1

## 2019-07-05 MED ORDER — MEPERIDINE HCL 50 MG/ML IJ SOLN
INTRAMUSCULAR | Status: DC | PRN
  Administered 2019-07-05 (×3): 25 mg

## 2019-07-05 MED ORDER — SODIUM CHLORIDE 0.9 % IV SOLN
INTRAVENOUS | Status: DC
  Administered 2019-07-05: 12:00:00 via INTRAVENOUS

## 2019-07-05 MED ORDER — MIDAZOLAM HCL 5 MG/5ML IJ SOLN
INTRAMUSCULAR | Status: AC
  Filled 2019-07-05: qty 10

## 2019-07-05 NOTE — Discharge Instructions (Signed)
No aspirin or NSAIDs for 24 hours. Resume other medications as before. Modified carb diet as before. No driving for 24 hours. Physician will call with biopsy results. Next colonoscopy in 5 years.   Colonoscopy, Adult, Care After This sheet gives you information about how to care for yourself after your procedure. Your doctor may also give you more specific instructions. If you have problems or questions, call your doctor. What can I expect after the procedure? After the procedure, it is common to have:  A small amount of blood in your poop for 24 hours.  Some gas.  Mild cramping or bloating in your belly. Follow these instructions at home: General instructions  For the first 24 hours after the procedure: ? Do not drive or use machinery. ? Do not sign important documents. ? Do not drink alcohol. ? Do your daily activities more slowly than normal. ? Eat foods that are soft and easy to digest.  Take over-the-counter or prescription medicines only as told by your doctor. To help cramping and bloating:   Try walking around.  Put heat on your belly (abdomen) as told by your doctor. Use a heat source that your doctor recommends, such as a moist heat pack or a heating pad. ? Put a towel between your skin and the heat source. ? Leave the heat on for 20-30 minutes. ? Remove the heat if your skin turns bright red. This is especially important if you cannot feel pain, heat, or cold. You can get burned. Eating and drinking   Drink enough fluid to keep your pee (urine) clear or pale yellow.  Return to your normal diet as told by your doctor. Avoid heavy or fried foods that are hard to digest.  Avoid drinking alcohol for as long as told by your doctor. Contact a doctor if:  You have blood in your poop (stool) 2-3 days after the procedure. Get help right away if:  You have more than a small amount of blood in your poop.  You see large clumps of tissue (blood clots) in your  poop.  Your belly is swollen.  You feel sick to your stomach (nauseous).  You throw up (vomit).  You have a fever.  You have belly pain that gets worse, and medicine does not help your pain. Summary  After the procedure, it is common to have a small amount of blood in your poop. You may also have mild cramping and bloating in your belly.  For the first 24 hours after the procedure, do not drive or use machinery, do not sign important documents, and do not drink alcohol.  Get help right away if you have a lot of blood in your poop, feel sick to your stomach, have a fever, or have more belly pain. This information is not intended to replace advice given to you by your health care provider. Make sure you discuss any questions you have with your health care provider. Document Released: 11/28/2010 Document Revised: 08/26/2017 Document Reviewed: 07/20/2016 Elsevier Patient Education  2020 [employer].  Hemorrhoids Hemorrhoids are swollen veins that may develop:  In the butt (rectum). These are called internal hemorrhoids.  Around the opening of the butt (anus). These are called external hemorrhoids. Hemorrhoids can cause pain, itching, or bleeding. Most of the time, they do not cause serious problems. They usually get better with diet changes, lifestyle changes, and other home treatments. What are the causes? This condition may be caused by:  Having trouble pooping (constipation).  Pushing hard (straining) to poop.  Watery poop (diarrhea).  Pregnancy.  Being very overweight (obese).  Sitting for long periods of time.  Heavy lifting or other activity that causes you to strain.  Anal sex.  Riding a bike for a long period of time. What are the signs or symptoms? Symptoms of this condition include:  Pain.  Itching or soreness in the butt.  Bleeding from the butt.  Leaking poop.  Swelling in the area.  One or more lumps around the opening of your butt. How is  this diagnosed? A doctor can often diagnose this condition by looking at the affected area. The doctor may also:  Do an exam that involves feeling the area with a gloved hand (digital rectal exam).  Examine the area inside your butt using a small tube (anoscope).  Order blood tests. This may be done if you have lost a lot of blood.  Have you get a test that involves looking inside the colon using a flexible tube with a camera on the end (sigmoidoscopy or colonoscopy). How is this treated? This condition can usually be treated at home. Your doctor may tell you to change what you eat, make lifestyle changes, or try home treatments. If these do not help, procedures can be done to remove the hemorrhoids or make them smaller. These may involve:  Placing rubber bands at the base of the hemorrhoids to cut off their blood supply.  Injecting medicine into the hemorrhoids to shrink them.  Shining a type of light energy onto the hemorrhoids to cause them to fall off.  Doing surgery to remove the hemorrhoids or cut off their blood supply. Follow these instructions at home: Eating and drinking   Eat foods that have a lot of fiber in them. These include whole grains, beans, nuts, fruits, and vegetables.  Ask your doctor about taking products that have added fiber (fibersupplements).  Reduce the amount of fat in your diet. You can do this by: ? Eating low-fat dairy products. ? Eating less red meat. ? Avoiding processed foods.  Drink enough fluid to keep your pee (urine) pale yellow. Managing pain and swelling   Take a warm-water bath (sitz bath) for 20 minutes to ease pain. Do this 3-4 times a day. You may do this in a bathtub or using a portable sitz bath that fits over the toilet.  If told, put ice on the painful area. It may be helpful to use ice between your warm baths. ? Put ice in a plastic bag. ? Place a towel between your skin and the bag. ? Leave the ice on for 20 minutes, 2-3  times a day. General instructions  Take over-the-counter and prescription medicines only as told by your doctor. ? Medicated creams and medicines may be used as told.  Exercise often. Ask your doctor how much and what kind of exercise is best for you.  Go to the bathroom when you have the urge to poop. Do not wait.  Avoid pushing too hard when you poop.  Keep your butt dry and clean. Use wet toilet paper or moist towelettes after pooping.  Do not sit on the toilet for a long time.  Keep all follow-up visits as told by your doctor. This is important. Contact a doctor if you:  Have pain and swelling that do not get better with treatment or medicine.  Have trouble pooping.  Cannot poop.  Have pain or swelling outside the area of the hemorrhoids. Get help  right away if you have:  Bleeding that will not stop. Summary  Hemorrhoids are swollen veins in the butt or around the opening of the butt.  They can cause pain, itching, or bleeding.  Eat foods that have a lot of fiber in them. These include whole grains, beans, nuts, fruits, and vegetables.  Take a warm-water bath (sitz bath) for 20 minutes to ease pain. Do this 3-4 times a day. This information is not intended to replace advice given to you by your health care provider. Make sure you discuss any questions you have with your health care provider. Document Released: 08/04/2008 Document Revised: 11/03/2018 Document Reviewed: 03/17/2018 Elsevier Patient Education  [place].  Colon Polyps  Polyps are tissue growths inside the body. Polyps can grow in many places, including the large intestine (colon). A polyp may be a round bump or a mushroom-shaped growth. You could have one polyp or several. Most colon polyps are noncancerous (benign). However, some colon polyps can become cancerous over time. Finding and removing the polyps early can help prevent this. What are the causes? The exact cause of colon polyps is not  known. What increases the risk? You are more likely to develop this condition if you:  Have a family history of colon cancer or colon polyps.  Are older than 20 or older than 45 if you are African American.  Have inflammatory bowel disease, such as ulcerative colitis or Crohn's disease.  Have certain hereditary conditions, such as: ? Familial adenomatous polyposis. ? Lynch syndrome. ? Turcot syndrome. ? Peutz-Jeghers syndrome.  Are overweight.  Smoke cigarettes.  Do not get enough exercise.  Drink too much alcohol.  Eat a diet that is high in fat and red meat and low in fiber.  Had childhood cancer that was treated with abdominal radiation. What are the signs or symptoms? Most polyps do not cause symptoms. If you have symptoms, they may include:  Blood coming from your rectum when having a bowel movement.  Blood in your stool. The stool may look dark red or black.  Abdominal pain.  A change in bowel habits, such as constipation or diarrhea. How is this diagnosed? This condition is diagnosed with a colonoscopy. This is a procedure in which a lighted, flexible scope is inserted into the anus and then passed into the colon to examine the area. Polyps are sometimes found when a colonoscopy is done as part of routine cancer screening tests. How is this treated? Treatment for this condition involves removing any polyps that are found. Most polyps can be removed during a colonoscopy. Those polyps will then be tested for cancer. Additional treatment may be needed depending on the results of testing. Follow these instructions at home: Lifestyle  Maintain a healthy weight, or lose weight if recommended by your health care provider.  Exercise every day or as told by your health care provider.  Do not use any products that contain nicotine or tobacco, such as cigarettes and e-cigarettes. If you need help quitting, ask your health care provider.  If you drink alcohol, limit how  much you have: ? 0-1 drink a day for women. ? 0-2 drinks a day for men.  Be aware of how much alcohol is in your drink. In the [place] one drink equals one 12 oz bottle of beer (355 mL), one 5 oz glass of wine (148 mL), or one 1 oz shot of hard liquor (44 mL). Eating and drinking   Eat foods that are high  in fiber, such as fruits, vegetables, and whole grains.  Eat foods that are high in calcium and vitamin D, such as milk, cheese, yogurt, eggs, liver, fish, and broccoli.  Limit foods that are high in fat, such as fried foods and desserts.  Limit the amount of red meat and processed meat you eat, such as hot dogs, sausage, bacon, and lunch meats. General instructions  Keep all follow-up visits as told by your health care provider. This is important. ? This includes having regularly scheduled colonoscopies. ? Talk to your health care provider about when you need a colonoscopy. Contact a health care provider if:  You have new or worsening bleeding during a bowel movement.  You have new or increased blood in your stool.  You have a change in bowel habits.  You lose weight for no known reason. Summary  Polyps are tissue growths inside the body. Polyps can grow in many places, including the colon.  Most colon polyps are noncancerous (benign), but some can become cancerous over time.  This condition is diagnosed with a colonoscopy.  Treatment for this condition involves removing any polyps that are found. Most polyps can be removed during a colonoscopy. This information is not intended to replace advice given to you by your health care provider. Make sure you discuss any questions you have with your health care provider. Document Released: 07/22/2004 Document Revised: 02/10/2018 Document Reviewed: 02/10/2018 Elsevier Patient Education  2020 [employer].

## 2019-07-05 NOTE — Op Note (Signed)
[facility] Patient Name: [name] Procedure Date: 07/05/2019 1:05 PM MRN: [id number] Date of Birth: 1956/07/05 Attending MD: Hildred Laser , MD CSN: [id number] Age: 63 Admit Type: Outpatient Procedure:                Colonoscopy Indications:              High risk colon cancer surveillance: Personal                            history of colonic polyps Providers:                Hildred Laser, MD, Janeece Riggers, RN, Raphael Gibney,                            Technician Referring MD:             Dione Housekeeper, MD Medicines:                Meperidine 75 mg IV, Midazolam 7 mg IV Complications:            No immediate complications. Estimated Blood Loss:     Estimated blood loss was minimal. Procedure:                Pre-Anesthesia Assessment:                           - Prior to the procedure, a History and Physical                            was performed, and patient medications and                            allergies were reviewed. The patient's tolerance of                            previous anesthesia was also reviewed. The risks                            and benefits of the procedure and the sedation                            options and risks were discussed with the patient.                            All questions were answered, and informed consent                            was obtained. Prior Anticoagulants: The patient has                            taken no previous anticoagulant or antiplatelet                            agents except for NSAID medication. ASA Grade  Assessment: II - A patient with mild systemic                            disease. After reviewing the risks and benefits,                            the patient was deemed in satisfactory condition to                            undergo the procedure.                           After obtaining informed consent, the colonoscope                            was passed under direct  vision. Throughout the                            procedure, the patient's blood pressure, pulse, and                            oxygen saturations were monitored continuously. The                            [id number]-[id number]) scope was introduced through                            the anus and advanced to the the cecum, identified                            by appendiceal orifice and ileocecal valve. The                            colonoscopy was somewhat difficult due to                            restricted mobility of the colon. Successful                            completion of the procedure was aided by increasing                            the dose of sedation medication. The patient                            tolerated the procedure well. The quality of the                            bowel preparation was good except the ascending                            colon was fair. The ileocecal valve, appendiceal  orifice, and rectum were photographed. Scope In: 1:26:41 PM Scope Out: 1:47:16 PM Scope Withdrawal Time: 0 hours 3 minutes 35 seconds  Total Procedure Duration: 0 hours 20 minutes 35 seconds  Findings:      The perianal and digital rectal examinations were normal.      Two polyps were found in the hepatic flexure. The polyps were 4 to 6 mm       in size. These polyps were removed with a cold snare. Resection was       complete, but the polyp tissue was only partially retrieved. The       pathology specimen was placed into Bottle Number 1.      The exam was otherwise normal throughout the examined colon.      External hemorrhoids were found during retroflexion. The hemorrhoids       were small. Impression:               - Two 4 to 6 mm polyps at the hepatic flexure,                            removed with a cold snare. Complete resection.                            Partial retrieval.                           - External hemorrhoids. Moderate  Sedation:      Moderate (conscious) sedation was administered by the endoscopy nurse       and supervised by the endoscopist. The following parameters were       monitored: oxygen saturation, heart rate, blood pressure, CO2       capnography and response to care. Total physician intraservice time was       28 minutes. Recommendation:           - Patient has a contact number available for                            emergencies. The signs and symptoms of potential                            delayed complications were discussed with the                            patient. Return to normal activities tomorrow.                            Written discharge instructions were provided to the                            patient.                           - Diabetic (ADA) diet today.                           - Continue present medications.                           -  No aspirin, ibuprofen, naproxen, or other                            non-steroidal anti-inflammatory drugs for 1 day.                           - Await pathology results.                           - Repeat colonoscopy in 5 years for surveillance. Procedure Code(s):        --- Professional ---                           [phone], Colonoscopy, flexible; with removal of                            tumor(s), polyp(s), or other lesion(s) by snare                            technique                           99153, Moderate sedation; each additional 15                            minutes intraservice time                           G0500, Moderate sedation services provided by the                            same physician or other qualified health care                            professional performing a gastrointestinal                            endoscopic service that sedation supports,                            requiring the presence of an independent trained                            observer to assist in the monitoring of the                             patient's level of consciousness and physiological                            status; initial 15 minutes of intra-service time;                            patient age 12 years or older (additional time 102  be reported with [id number], as appropriate) Diagnosis Code(s):        --- Professional ---                           Z86.010, Personal history of colonic polyps                           K63.5, Polyp of colon                           K64.4, Residual hemorrhoidal skin tags CPT copyright 2019 American Medical Association. All rights reserved. The codes documented in this report are preliminary and upon coder review may  be revised to meet current compliance requirements. Hildred Laser, MD Hildred Laser, MD 07/05/2019 2:00:08 PM This report has been signed electronically. Number of Addenda: 0

## 2019-07-05 NOTE — H&P (Signed)
[name] is an 63 y.o. male.   Chief Complaint: Patient is here for colonoscopy. HPI: Patient is 63 year old Caucasian male who has history of colonic adenomas as well as family history of CRC who is here for surveillance colonoscopy.  While he has had colonoscopies on prior exams his last exam was in 2013 with removal of 3 small polyps and these were hyperplastic.  His bowels are regular.  He denies abdominal pain or rectal bleeding. Family history significant for CRC in mother who was 69 at the time of diagnosis and died at 51 because of peripheral vascular disease.  Maternal aunt also had colon carcinoma at age 88 and died in less than a year because of metastatic disease.  Past Medical History:  Diagnosis Date  . Arthritis    shoulders and hands  . Diabetes mellitus without complication ([place])   . Enlarged prostate   . GERD (gastroesophageal reflux disease)   . Hyperlipidemia   . Hypertension   . Trigger thumb of left hand     Past Surgical History:  Procedure Laterality Date  . CARPAL TUNNEL RELEASE    . COLONOSCOPY  03/24/2012   Procedure: COLONOSCOPY;  Surgeon: Rogene Houston, MD;  [facility];  Service: Endoscopy;  Laterality: N/A;  100  . SHOULDER ACROMIOPLASTY Left 06/30/2017   Procedure: SHOULDER ACROMIOPLASTY;  Surgeon: Earlie Server, MD;  Location: [place];  Service: Orthopedics;  Laterality: Left;  . SHOULDER ARTHROSCOPY WITH OPEN ROTATOR CUFF REPAIR AND DISTAL CLAVICLE ACROMINECTOMY Left 06/30/2017   Procedure: LEFT SHOULDER ARTHROSCOPIC DEBRIDEMENT, WITH OPEN ROTATOR CUFF REPAIR, OPEN DISTAL CLAVICLE EXCISION, ACROMIOPLASTY;  Surgeon: Earlie Server, MD;  Location: [place];  Service: Orthopedics;  Laterality: Left;  . TRIGGER FINGER RELEASE Left 06/30/2017   Procedure: RELEASE TRIGGER FINGER/A-1 PULLEY LEFT THUMB;  Surgeon: Earlie Server, MD;  Location: [place];  Service: Orthopedics;  Laterality:  Left;    History reviewed. No pertinent family history. Social History:  reports that he quit smoking about 17 years ago. His smoking use included cigarettes. He quit after 0.00 years of use. He has never used smokeless tobacco. He reports that he does not drink alcohol or use drugs.  Allergies:  Allergies  Allergen Reactions  . Cozaar     Made head tingle and heart race  . Lisinopril Other (See Comments)    cough    Medications Prior to Admission  Medication Sig Dispense Refill  . amLODipine (NORVASC) 10 MG tablet Take 10 mg by mouth at bedtime.     . B Complex-C (B-COMPLEX WITH VITAMIN C) tablet Take 1 tablet by mouth daily.    Marland Kitchen Bioflavonoid Products (SUPER C-500 PO) Take 1 tablet by mouth daily.    . celecoxib (CELEBREX) 200 MG capsule Take 200 mg by mouth daily as needed (inflammation/joint tightness.).     Marland Kitchen finasteride (PROSCAR) 5 MG tablet Take 5 mg by mouth daily.    . hydrochlorothiazide (MICROZIDE) 12.5 MG capsule Take 12.5 mg by mouth daily.    . metFORMIN (GLUCOPHAGE-XR) 500 MG 24 hr tablet Take 500 mg by mouth 2 (two) times daily.    . pantoprazole (PROTONIX) 40 MG tablet Take 40 mg by mouth daily as needed (acid reflux/indigestion.).    Marland Kitchen Polyethyl Glycol-Propyl Glycol (LUBRICANT EYE DROPS) 0.4-0.3 % SOLN Place 1 drop into both eyes 3 (three) times daily as needed (dry/irritated eyes.).    Marland Kitchen pravastatin (PRAVACHOL) 40 MG tablet Take 40 mg  by mouth at bedtime.       Results for orders placed or performed during the hospital encounter of 07/05/19 (from the past 48 hour(s))  Glucose, capillary     Status: None   Collection Time: 07/05/19 12:19 PM  Result Value Ref Range   Glucose-Capillary 98 70 - 99 mg/dL   No results found.  ROS  Blood pressure (!) 141/81, pulse 82, temperature 98.3 F (36.8 C), temperature source Oral, resp. rate 15, height 5\' 7"  (1.702 m), weight 82.1 kg, SpO2 97 %. Physical Exam  Constitutional: He appears well-developed and well-nourished.   HENT:  Mouth/Throat: Oropharynx is clear and moist.  Eyes: Conjunctivae are normal. No scleral icterus.  Neck: No thyromegaly present.  Cardiovascular: Normal rate, regular rhythm and normal heart sounds.  No murmur heard. Respiratory: Effort normal and breath sounds normal.  GI: Soft. He exhibits no distension and no mass. There is no abdominal tenderness.  Musculoskeletal:        General: No edema.  Lymphadenopathy:    He has no cervical adenopathy.  Neurological: He is alert.  Skin: Skin is warm and dry.     Assessment/Plan History of colonic polyps. Family history of CRC in mother and maternal aunt. Surveillance colonoscopy.  Hildred Laser, MD 07/05/2019, 1:16 PM

## 2019-07-07 ENCOUNTER — Encounter (HOSPITAL_COMMUNITY): Payer: Self-pay | Admitting: Internal Medicine

## 2019-11-06 ENCOUNTER — Other Ambulatory Visit: Payer: Self-pay

## 2019-11-06 ENCOUNTER — Ambulatory Visit: Payer: Managed Care, Other (non HMO) | Attending: Internal Medicine

## 2019-11-06 DIAGNOSIS — Z20822 Contact with and (suspected) exposure to covid-19: Secondary | ICD-10-CM

## 2019-11-08 LAB — NOVEL CORONAVIRUS, NAA: SARS-CoV-2, NAA: DETECTED — AB

## 2019-11-09 ENCOUNTER — Telehealth: Payer: Self-pay | Admitting: Unknown Physician Specialty

## 2019-11-09 NOTE — Telephone Encounter (Signed)
Called to discuss with patient about Covid symptoms and the use of bamlanivimab, a monoclonal antibody infusion for those with mild to moderate Covid symptoms and at a high risk of hospitalization.  Pt is qualified for this infusion at the [facility] infusion center due to Age >55 and Hypertension   Message left to call back

## 2020-06-04 ENCOUNTER — Other Ambulatory Visit: Payer: Self-pay

## 2020-06-04 ENCOUNTER — Encounter: Payer: Self-pay | Admitting: Orthopaedic Surgery

## 2020-06-04 ENCOUNTER — Ambulatory Visit (INDEPENDENT_AMBULATORY_CARE_PROVIDER_SITE_OTHER): Payer: Managed Care, Other (non HMO)

## 2020-06-04 ENCOUNTER — Ambulatory Visit: Payer: Managed Care, Other (non HMO) | Admitting: Orthopaedic Surgery

## 2020-06-04 DIAGNOSIS — M7541 Impingement syndrome of right shoulder: Secondary | ICD-10-CM | POA: Insufficient documentation

## 2020-06-04 DIAGNOSIS — M19019 Primary osteoarthritis, unspecified shoulder: Secondary | ICD-10-CM | POA: Insufficient documentation

## 2020-06-04 DIAGNOSIS — M19011 Primary osteoarthritis, right shoulder: Secondary | ICD-10-CM | POA: Diagnosis not present

## 2020-06-04 DIAGNOSIS — G8929 Other chronic pain: Secondary | ICD-10-CM

## 2020-06-04 DIAGNOSIS — M25511 Pain in right shoulder: Secondary | ICD-10-CM

## 2020-06-04 NOTE — Progress Notes (Signed)
Office Visit Note   Patient: [name]           Date of Birth: [birth year]           MRN: [id number] Visit Date: 06/04/2020              Requested by: No referring provider defined for this encounter. PCP: No primary care provider on file.   Assessment & Plan: Visit Diagnoses:  1. Chronic right shoulder pain   2. Impingement syndrome of right shoulder   3. Arthritis of right acromioclavicular joint     Plan:  #1: Corticosteroid injection was given subacromially through a lateral approach.  Tolerated the procedure well.  Postinjection he had no pain with motion or empty can testing. #2: I have asked him to check his glucoses several times since he is on Metformin and a diabetic. #3: He will give this a week or 2 to see if it makes an improvement if not and he can give [place] a call back and we will schedule him for an MRI scan of the right shoulder. #4: Patient was seen by [name] also.  Follow-Up Instructions: Return if symptoms worsen or fail to improve call for MRI scan.   Orders:  Orders Placed This Encounter  Procedures   XR Shoulder Right   No orders of the defined types were placed in this encounter.     Procedures: No procedures performed   Clinical Data: No additional findings.   Subjective: Chief Complaint  Patient presents with   Right Shoulder - Pain    HPI  [name] is a very pleasant 64 year old white male who presents with a 61-monthhistory of pain and discomfort in his right shoulder.  Several years ago that of about 4 to 5 years ago he had a cortisone injection in his right shoulder and he stated that he had improvement of pain and discomfort.  However over the last 3 months he has had a recent flare with his.  It hurts him when taking off his shirt.  Also when reaching up over his head.  Denies any neurovascular compromise.  Denies any numbness or tingling.  Review of Systems  Constitutional: Negative.   HENT: Negative.   Respiratory:  Negative.   Cardiovascular: Negative.   Gastrointestinal: Negative.   Genitourinary: Negative.   Skin: Negative.   Neurological: Negative.   Hematological: Negative.   Psychiatric/Behavioral: Negative.        Objective: Vital Signs: There were no vitals taken for this visit.  Physical Exam Constitutional:      Appearance: He is well-developed.  Eyes:     Pupils: Pupils are equal, round, and reactive to light.  Pulmonary:     Effort: Pulmonary effort is normal.  Skin:    General: Skin is warm and dry.  Neurological:     Mental Status: He is alert and oriented to person, place, and time.  Psychiatric:        Behavior: Behavior normal.     Ortho Exam  Exam today reveals some tenderness [facility] the [facility] as well as in the proximal third of the clavicle.  He has near full range of motion.  Mildly positive empty can test.  Good strength in all planes tested.  Neurovascular intact distally.  Specialty Comments:  No specialty comments available.  Imaging: XR Shoulder Right  Result Date: 06/04/2020 Three-view x-ray of the shoulder as well as the neck of the lordotic view was obtained.  He  has a mild type II acromium.  There is some AC degenerative change also noted.  Maintenance of a glenohumeral joint on the axillary lateral.  No lesions at the proximal clavicle.    PMFS History: Current Outpatient Medications  Medication Sig Dispense Refill   amLODipine (NORVASC) 10 MG tablet Take 10 mg by mouth at bedtime.      Bioflavonoid Products (SUPER C-500 PO) Take 1 tablet by mouth daily.     Blood Glucose Monitoring Suppl (ONE TOUCH ULTRA 2) w/Device KIT Use to check blood sugar daily as directed by physician.     celecoxib (CELEBREX) 200 MG capsule Take 200 mg by mouth daily as needed (inflammation/joint tightness.).      finasteride (PROSCAR) 5 MG tablet Take 5 mg by mouth daily.     glucose blood test strip See admin instructions.     hydrochlorothiazide (MICROZIDE)  12.5 MG capsule Take 12.5 mg by mouth daily.     metFORMIN (GLUCOPHAGE-XR) 500 MG 24 hr tablet Take 500 mg by mouth 2 (two) times daily.     Polyethyl Glycol-Propyl Glycol (LUBRICANT EYE DROPS) 0.4-0.3 % SOLN Place 1 drop into both eyes 3 (three) times daily as needed (dry/irritated eyes.).     pravastatin (PRAVACHOL) 40 MG tablet Take 40 mg by mouth at bedtime.      No current facility-administered medications for this visit.    Patient Active Problem List   Diagnosis Date Noted   Impingement syndrome of right shoulder 06/04/2020   AC (acromioclavicular) arthritis 06/04/2020   Family hx of colon cancer 05/04/2019   History of colonic polyps 05/04/2019   Hypertension 02/22/2012   High cholesterol 02/22/2012   Past Medical History:  Diagnosis Date   Arthritis    shoulders and hands   Diabetes mellitus without complication (HCC)    Enlarged prostate    GERD (gastroesophageal reflux disease)    Hyperlipidemia    Hypertension    Trigger thumb of left hand     History reviewed. No pertinent family history.  Past Surgical History:  Procedure Laterality Date   CARPAL TUNNEL RELEASE     COLONOSCOPY  03/24/2012   Procedure: COLONOSCOPY;  Surgeon: Luis Houston, MD;  [facility];  Service: Endoscopy;  Laterality: N/A;  100   COLONOSCOPY N/A 07/05/2019   Procedure: COLONOSCOPY;  Surgeon: Luis Houston, MD;  [facility];  Service: Endoscopy;  Laterality: N/A;  1:00   POLYPECTOMY  07/05/2019   Procedure: POLYPECTOMY;  Surgeon: Luis Houston, MD;  [facility];  Service: Endoscopy;;   SHOULDER ACROMIOPLASTY Left 06/30/2017   Procedure: SHOULDER ACROMIOPLASTY;  Surgeon: Luis Server, MD;  Location: [place];  Service: Orthopedics;  Laterality: Left;   SHOULDER ARTHROSCOPY WITH OPEN ROTATOR CUFF REPAIR AND DISTAL CLAVICLE ACROMINECTOMY Left 06/30/2017   Procedure: LEFT SHOULDER ARTHROSCOPIC DEBRIDEMENT, WITH OPEN  ROTATOR CUFF REPAIR, OPEN DISTAL CLAVICLE EXCISION, ACROMIOPLASTY;  Surgeon: Luis Server, MD;  Location: [place];  Service: Orthopedics;  Laterality: Left;   TRIGGER FINGER RELEASE Left 06/30/2017   Procedure: RELEASE TRIGGER FINGER/A-1 PULLEY LEFT THUMB;  Surgeon: Luis Server, MD;  Location: [place];  Service: Orthopedics;  Laterality: Left;   Social History   Occupational History   Not on file  Tobacco Use   Smoking status: Former Smoker    Years: 0.00    Types: Cigarettes    Quit date: 06/30/2002    Years since quitting: 41.9  Smokeless tobacco: Never Used  Vaping Use   Vaping Use: Never used  Substance and Sexual Activity   Alcohol use: No   Drug use: No   Sexual activity: Yes

## 2020-07-12 ENCOUNTER — Other Ambulatory Visit: Payer: Self-pay | Admitting: Radiology

## 2020-07-12 ENCOUNTER — Telehealth: Payer: Self-pay | Admitting: Orthopaedic Surgery

## 2020-07-12 DIAGNOSIS — M19011 Primary osteoarthritis, right shoulder: Secondary | ICD-10-CM

## 2020-07-12 DIAGNOSIS — G8929 Other chronic pain: Secondary | ICD-10-CM

## 2020-07-12 DIAGNOSIS — M7541 Impingement syndrome of right shoulder: Secondary | ICD-10-CM

## 2020-07-12 NOTE — Telephone Encounter (Signed)
Patient called.   He wants to proceed with having the MRI done.   Call back: [phone]

## 2020-07-12 NOTE — Telephone Encounter (Signed)
Order for MRI of Right shoulder placed and I called and advised the patient of this and advised that once he has scheduled the MRI to call our office and sched a follow up appt with Dr. Durward Fortes to review.  Patient states that he understands.

## 2020-08-06 ENCOUNTER — Ambulatory Visit
Admission: RE | Admit: 2020-08-06 | Discharge: 2020-08-06 | Disposition: A | Discharge status: Home / Self Care | Payer: Managed Care, Other (non HMO) | Source: Ambulatory Visit | Attending: Orthopaedic Surgery | Admitting: Orthopaedic Surgery

## 2020-08-06 ENCOUNTER — Other Ambulatory Visit: Payer: Self-pay

## 2020-08-06 DIAGNOSIS — M7541 Impingement syndrome of right shoulder: Secondary | ICD-10-CM

## 2020-08-06 DIAGNOSIS — M19011 Primary osteoarthritis, right shoulder: Secondary | ICD-10-CM

## 2020-08-06 DIAGNOSIS — M25511 Pain in right shoulder: Secondary | ICD-10-CM

## 2020-08-13 ENCOUNTER — Encounter: Payer: Self-pay | Admitting: Orthopaedic Surgery

## 2020-08-13 ENCOUNTER — Ambulatory Visit: Payer: Managed Care, Other (non HMO) | Admitting: Orthopaedic Surgery

## 2020-08-13 ENCOUNTER — Other Ambulatory Visit: Payer: Self-pay

## 2020-08-13 VITALS — Ht 67.0 in | Wt 181.0 lb

## 2020-08-13 DIAGNOSIS — M19011 Primary osteoarthritis, right shoulder: Secondary | ICD-10-CM | POA: Diagnosis not present

## 2020-08-13 DIAGNOSIS — S46219A Strain of muscle, fascia and tendon of other parts of biceps, unspecified arm, initial encounter: Secondary | ICD-10-CM | POA: Diagnosis not present

## 2020-08-13 DIAGNOSIS — M75111 Incomplete rotator cuff tear or rupture of right shoulder, not specified as traumatic: Secondary | ICD-10-CM | POA: Diagnosis not present

## 2020-08-13 NOTE — Progress Notes (Signed)
Office Visit Note   Patient: [name]           Date of Birth: 18-Apr-1956           MRN: [id number] Visit Date: 08/13/2020              Requested by: No referring provider defined for this encounter. PCP: Chesley Noon, MD   Assessment & Plan: Visit Diagnoses:  1. Arthritis of right acromioclavicular joint   2. Nontraumatic incomplete tear of right rotator cuff   3. Biceps tendon tear     Plan:  #1: At this time our plan is to consider an arthroscopic subacromial decompression with distal clavicle excision, rotator cuff repair, biceps tenodesis.  Procedure risk and benefits were explained to him and he is understanding.  He would like to proceed with this as soon as possible.  Follow-Up Instructions: No follow-ups on file.   Face-to-face time spent with patient was greater than 30 minutes.  Greater than 50% of the time was spent in counseling and coordination of care.  Orders:  No orders of the defined types were placed in this encounter.  No orders of the defined types were placed in this encounter.     Procedures: No procedures performed   Clinical Data: No additional findings.   Subjective: Chief Complaint  Patient presents with  . Right Shoulder - Follow-up    MRI review   HPI Patient presents today for follow up on his right shoulder. He had an MRI of his shoulder and is here today for those results. Patient states that his shoulder has worsened and now hurts into his neck as well. He takes celebrex before bedtime.    Review of Systems  Constitutional: Negative for fatigue.  HENT: Negative for ear pain.   Eyes: Negative for pain.  Respiratory: Negative for shortness of breath.   Gastrointestinal: Negative for constipation and diarrhea.  Endocrine: Negative for cold intolerance and heat intolerance.  Genitourinary: Negative for difficulty urinating.  Musculoskeletal: Negative for joint swelling.  Skin: Negative for rash.    Allergic/Immunologic: Negative for food allergies.  Neurological: Negative for weakness.  Hematological: Does not bruise/bleed easily.  Psychiatric/Behavioral: Negative for sleep disturbance.     Objective: Vital Signs: Ht 5\' 7"  (1.702 m)   Wt 181 lb (82.1 kg)   BMI 28.35 kg/m   Physical Exam Constitutional:      Appearance: Normal appearance. He is well-developed and normal weight.  HENT:     Head: Normocephalic.  Eyes:     Pupils: Pupils are equal, round, and reactive to light.  Pulmonary:     Effort: Pulmonary effort is normal.  Skin:    General: Skin is warm and dry.  Neurological:     Mental Status: He is alert and oriented to person, place, and time.  Psychiatric:        Mood and Affect: Mood normal.        Behavior: Behavior normal.        Thought Content: Thought content normal.        Judgment: Judgment normal.     Ortho Exam  Examination today reveals has empty can test.  He does have pain with range of motion of the shoulder.  Tenderness at the [facility] joint.  Does have a little bit of weakness with abduction and forward flexion.  Specialty Comments:  No specialty comments available.  Imaging: MR SHOULDER RIGHT WO CONTRAST  Result Date: 08/07/2020 CLINICAL DATA:  Right  shoulder pain and limited range of motion for 2 years. EXAM: MRI OF THE RIGHT SHOULDER WITHOUT CONTRAST TECHNIQUE: Multiplanar, multisequence MR imaging of the shoulder was performed. No intravenous contrast was administered. COMPARISON:  None. FINDINGS: Rotator cuff: The patient has rotator cuff tendinopathy with thickening and intermediate increased T2 signal seen. A partial width, full-thickness tear of the anterior and far lateral supraspinatus measures 0.9 cm from front to back. There is little to no retraction. The subscapularis is attenuated but no focal tear is identified. Muscles: Mild appearing subscapularis atrophy.  Otherwise negative. Biceps long head:  Completely torn from the superior  labrum. Acromioclavicular Joint: Bulky osteoarthritis. Type 2 acromion. There is subacromial spurring. A small volume of fluid is present in the subacromial/subdeltoid bursa. Glenohumeral Joint: Negative. Labrum:  Intact. Bones:  No fracture or focal lesion. Other: None. IMPRESSION: Complete tear of the long head of biceps from the superior labrum. Rotator cuff tendinopathy with a 0.9 cm from front to back full-thickness tear of the anterior and far lateral supraspinatus. There is minimal retraction and no atrophy. Markedly attenuated subscapularis tendon without focal tear also noted. Mild subscapularis atrophy is seen. Bulky acromioclavicular osteoarthritis. Subacromial spurring also noted. Small volume of subacromial/subdeltoid fluid compatible with bursitis. Electronically Signed   By: Inge Rise M.D.   On: 08/07/2020 14:40    PMFS History: Patient Active Problem List   Diagnosis Date Noted  . Nontraumatic incomplete tear of right rotator cuff 08/13/2020  . Biceps tendon tear 08/13/2020  . Impingement syndrome of right shoulder 06/04/2020  . AC (acromioclavicular) arthritis 06/04/2020  . Family hx of colon cancer 05/04/2019  . History of colonic polyps 05/04/2019  . Hypertension 02/22/2012  . High cholesterol 02/22/2012   Past Medical History:  Diagnosis Date  . Arthritis    shoulders and hands  . Diabetes mellitus without complication ([place])   . Enlarged prostate   . GERD (gastroesophageal reflux disease)   . Hyperlipidemia   . Hypertension   . Trigger thumb of left hand     History reviewed. No pertinent family history.  Past Surgical History:  Procedure Laterality Date  . CARPAL TUNNEL RELEASE    . COLONOSCOPY  03/24/2012   Procedure: COLONOSCOPY;  Surgeon: Rogene Houston, MD;  [facility];  Service: Endoscopy;  Laterality: N/A;  100  . COLONOSCOPY N/A 07/05/2019   Procedure: COLONOSCOPY;  Surgeon: Rogene Houston, MD;  [facility];  Service:  Endoscopy;  Laterality: N/A;  1:00  . POLYPECTOMY  07/05/2019   Procedure: POLYPECTOMY;  Surgeon: Rogene Houston, MD;  [facility];  Service: Endoscopy;;  . SHOULDER ACROMIOPLASTY Left 06/30/2017   Procedure: SHOULDER ACROMIOPLASTY;  Surgeon: Earlie Server, MD;  Location: [place];  Service: Orthopedics;  Laterality: Left;  . SHOULDER ARTHROSCOPY WITH OPEN ROTATOR CUFF REPAIR AND DISTAL CLAVICLE ACROMINECTOMY Left 06/30/2017   Procedure: LEFT SHOULDER ARTHROSCOPIC DEBRIDEMENT, WITH OPEN ROTATOR CUFF REPAIR, OPEN DISTAL CLAVICLE EXCISION, ACROMIOPLASTY;  Surgeon: Earlie Server, MD;  Location: [place];  Service: Orthopedics;  Laterality: Left;  . TRIGGER FINGER RELEASE Left 06/30/2017   Procedure: RELEASE TRIGGER FINGER/A-1 PULLEY LEFT THUMB;  Surgeon: Earlie Server, MD;  Location: [place];  Service: Orthopedics;  Laterality: Left;   Social History   Occupational History  . Not on file  Tobacco Use  . Smoking status: Former Smoker    Years: 0.00    Types: Cigarettes    Quit date: 06/30/2002  Years since quitting: 18.1  . Smokeless tobacco: Never Used  Vaping Use  . Vaping Use: Never used  Substance and Sexual Activity  . Alcohol use: No  . Drug use: No  . Sexual activity: Yes

## 2020-08-15 ENCOUNTER — Telehealth: Payer: Self-pay | Admitting: Orthopaedic Surgery

## 2020-08-22 ENCOUNTER — Other Ambulatory Visit: Payer: Self-pay | Admitting: Orthopedic Surgery

## 2020-08-22 ENCOUNTER — Encounter: Payer: Self-pay | Admitting: Orthopaedic Surgery

## 2020-08-22 DIAGNOSIS — M7541 Impingement syndrome of right shoulder: Secondary | ICD-10-CM | POA: Diagnosis not present

## 2020-08-22 DIAGNOSIS — M75121 Complete rotator cuff tear or rupture of right shoulder, not specified as traumatic: Secondary | ICD-10-CM

## 2020-08-22 DIAGNOSIS — S46121A Laceration of muscle, fascia and tendon of long head of biceps, right arm, initial encounter: Secondary | ICD-10-CM

## 2020-08-22 DIAGNOSIS — M19011 Primary osteoarthritis, right shoulder: Secondary | ICD-10-CM | POA: Diagnosis not present

## 2020-08-22 MED ORDER — OXYCODONE HCL 5 MG PO TABS: 5.0000 mg | ORAL_TABLET | ORAL | 0 refills | Status: AC | PRN

## 2020-08-23 ENCOUNTER — Telehealth: Payer: Self-pay

## 2020-08-23 NOTE — Telephone Encounter (Signed)
I called.

## 2020-08-23 NOTE — Telephone Encounter (Signed)
Patient's wife called stating that Rx for Oxycodone is not working.  Would like to know if a stronger Rx could be sent to patient's pharmacy?  Patient had surgery with Dr. Durward Fortes on Thursday, 08/22/2020, right shoulder.  Cb# [phone].  Please advise.  Thank you.

## 2020-08-26 NOTE — Telephone Encounter (Signed)
Noted! Thank you

## 2020-08-28 ENCOUNTER — Inpatient Hospital Stay: Payer: Managed Care, Other (non HMO) | Admitting: Orthopaedic Surgery

## 2020-08-28 ENCOUNTER — Other Ambulatory Visit: Payer: Self-pay

## 2020-08-29 ENCOUNTER — Ambulatory Visit (INDEPENDENT_AMBULATORY_CARE_PROVIDER_SITE_OTHER): Payer: Managed Care, Other (non HMO) | Admitting: Orthopaedic Surgery

## 2020-08-29 ENCOUNTER — Other Ambulatory Visit: Payer: Self-pay

## 2020-08-29 ENCOUNTER — Encounter: Payer: Self-pay | Admitting: Orthopaedic Surgery

## 2020-08-29 VITALS — Ht 67.0 in | Wt 181.0 lb

## 2020-08-29 DIAGNOSIS — M75111 Incomplete rotator cuff tear or rupture of right shoulder, not specified as traumatic: Secondary | ICD-10-CM

## 2020-08-29 DIAGNOSIS — S46219A Strain of muscle, fascia and tendon of other parts of biceps, unspecified arm, initial encounter: Secondary | ICD-10-CM

## 2020-08-29 MED ORDER — OXYCODONE-ACETAMINOPHEN 5-325 MG PO TABS: 1.0000 | ORAL_TABLET | Freq: Four times a day (QID) | ORAL | 0 refills | Status: AC | PRN

## 2020-08-29 NOTE — Progress Notes (Signed)
Office Visit Note   Patient: [name]           Date of Birth: 09/07/1956           MRN: [id number] Visit Date: 08/29/2020              Requested by: Chesley Noon, MD [place],  [place] [id number] PCP: Chesley Noon, MD   Assessment & Plan: Visit Diagnoses:  1. Biceps tendon tear   2. Nontraumatic incomplete tear of right rotator cuff     Plan: 1 week status post mini open rotator cuff tear repair, biceps tenodesis and SCD and DCR right shoulder.  Doing well.  Wounds are healing nicely without evidence of infection.  Neurologically intact.  Continue with the sling for 6 weeks.  Begin passive range of motion and physical therapy and return in 2 weeks.  Renew oxycodone.  Follow-Up Instructions: Return in about 2 weeks (around 09/12/2020).   Orders:  No orders of the defined types were placed in this encounter.  Meds ordered this encounter  Medications  . oxyCODONE-acetaminophen (PERCOCET/ROXICET) 5-325 MG tablet    Sig: Take 1 tablet by mouth every 6 (six) hours as needed for severe pain.    Dispense:  30 tablet    Refill:  0      Procedures: No procedures performed   Clinical Data: No additional findings.   Subjective: Chief Complaint  Patient presents with  . Right Shoulder - Follow-up    Right shoulder scope 08/22/2020  Patient presents today for follow up on his right shoulder. He is now one week out from left shoulder arthroscopy. He is doing well. His pain is usually worse at night. He is taking oxycodone, but concerned about running out. He is wearing his sling.   HPI  Review of Systems   Objective: Vital Signs: Ht 5\' 7"  (1.702 m)   Wt 181 lb (82.1 kg)   BMI 28.35 kg/m   Physical Exam  Ortho Exam right shoulder incisions healing without problem.  New Steri-Strips applied.  Good grip and good release.  Specialty Comments:  No specialty comments available.  Imaging: No results found.   PMFS History: Patient Active  Problem List   Diagnosis Date Noted  . Nontraumatic incomplete tear of right rotator cuff 08/13/2020  . Biceps tendon tear 08/13/2020  . Impingement syndrome of right shoulder 06/04/2020  . AC (acromioclavicular) arthritis 06/04/2020  . Family hx of colon cancer 05/04/2019  . History of colonic polyps 05/04/2019  . Hypertension 02/22/2012  . High cholesterol 02/22/2012   Past Medical History:  Diagnosis Date  . Arthritis    shoulders and hands  . Diabetes mellitus without complication ([place])   . Enlarged prostate   . GERD (gastroesophageal reflux disease)   . Hyperlipidemia   . Hypertension   . Trigger thumb of left hand     History reviewed. No pertinent family history.  Past Surgical History:  Procedure Laterality Date  . CARPAL TUNNEL RELEASE    . COLONOSCOPY  03/24/2012   Procedure: COLONOSCOPY;  Surgeon: Rogene Houston, MD;  [facility];  Service: Endoscopy;  Laterality: N/A;  100  . COLONOSCOPY N/A 07/05/2019   Procedure: COLONOSCOPY;  Surgeon: Rogene Houston, MD;  [facility];  Service: Endoscopy;  Laterality: N/A;  1:00  . POLYPECTOMY  07/05/2019   Procedure: POLYPECTOMY;  Surgeon: Rogene Houston, MD;  [facility];  Service: Endoscopy;;  .  SHOULDER ACROMIOPLASTY Left 06/30/2017   Procedure: SHOULDER ACROMIOPLASTY;  Surgeon: Earlie Server, MD;  Location: [place];  Service: Orthopedics;  Laterality: Left;  . SHOULDER ARTHROSCOPY WITH OPEN ROTATOR CUFF REPAIR AND DISTAL CLAVICLE ACROMINECTOMY Left 06/30/2017   Procedure: LEFT SHOULDER ARTHROSCOPIC DEBRIDEMENT, WITH OPEN ROTATOR CUFF REPAIR, OPEN DISTAL CLAVICLE EXCISION, ACROMIOPLASTY;  Surgeon: Earlie Server, MD;  Location: [place];  Service: Orthopedics;  Laterality: Left;  . TRIGGER FINGER RELEASE Left 06/30/2017   Procedure: RELEASE TRIGGER FINGER/A-1 PULLEY LEFT THUMB;  Surgeon: Earlie Server, MD;  Location: [place];  Service:  Orthopedics;  Laterality: Left;   Social History   Occupational History  . Not on file  Tobacco Use  . Smoking status: Former Smoker    Years: 0.00    Types: Cigarettes    Quit date: 06/30/2002    Years since quitting: 18.1  . Smokeless tobacco: Never Used  Vaping Use  . Vaping Use: Never used  Substance and Sexual Activity  . Alcohol use: No  . Drug use: No  . Sexual activity: Yes

## 2020-09-11 ENCOUNTER — Encounter: Payer: Self-pay | Admitting: Orthopaedic Surgery

## 2020-09-11 ENCOUNTER — Ambulatory Visit (INDEPENDENT_AMBULATORY_CARE_PROVIDER_SITE_OTHER): Payer: Managed Care, Other (non HMO) | Admitting: Orthopaedic Surgery

## 2020-09-11 ENCOUNTER — Other Ambulatory Visit: Payer: Self-pay

## 2020-09-11 VITALS — Ht 67.0 in | Wt 181.0 lb

## 2020-09-11 DIAGNOSIS — M75111 Incomplete rotator cuff tear or rupture of right shoulder, not specified as traumatic: Secondary | ICD-10-CM

## 2020-09-11 DIAGNOSIS — S46219A Strain of muscle, fascia and tendon of other parts of biceps, unspecified arm, initial encounter: Secondary | ICD-10-CM

## 2020-09-11 DIAGNOSIS — M19011 Primary osteoarthritis, right shoulder: Secondary | ICD-10-CM

## 2020-09-11 NOTE — Progress Notes (Signed)
Office Visit Note   Patient: [name]           Date of Birth: October 21, 1956           MRN: [id number] Visit Date: 09/11/2020              Requested by: Chesley Noon, MD [place],  [place] [address] PCP: Chesley Noon, MD   Assessment & Plan: Visit Diagnoses:  1. Nontraumatic incomplete tear of right rotator cuff   2. Biceps tendon tear   3. Arthritis of right acromioclavicular joint     Plan: 3 weeks status post right shoulder surgery including an arthroscopic SCD, DCR and mini open rotator cuff tear repair and biceps tenodesis.  Doing well.  Attending physical therapy at [facility] next door.  Continues with the sling.  Taking 2-3 oxycodone daily but "cutting back".  Think he is doing well and we will plan to see him back in 2 weeks.  Continue with the sling  Follow-Up Instructions: Return in about 2 weeks (around 09/25/2020).   Orders:  No orders of the defined types were placed in this encounter.  No orders of the defined types were placed in this encounter.     Procedures: No procedures performed   Clinical Data: No additional findings.   Subjective: Chief Complaint  Patient presents with  . Right Shoulder - Follow-up    Right shoulder scope 08/22/2020  Patient presents today for follow up on his right shoulder. He had a right shoulder scope on 08/22/2020. He is now 3 weeks out from surgery. Patient states that he is improving. He is going to physical therapy twice weekly. He is taking oxycodone 2-3times daily. He is in a sling. He is right hand dominant.   HPI  Review of Systems   Objective: Vital Signs: Ht 5\' 7"  (1.702 m)   Wt 181 lb (82.1 kg)   BMI 28.35 kg/m   Physical Exam  Ortho Exam right shoulder incisions healing without problem.  Neurologically intact.  I can abduct about 90 degrees and flex about the same passively  Specialty Comments:  No specialty comments available.  Imaging: No results found.   PMFS  History: Patient Active Problem List   Diagnosis Date Noted  . Nontraumatic incomplete tear of right rotator cuff 08/13/2020  . Biceps tendon tear 08/13/2020  . Impingement syndrome of right shoulder 06/04/2020  . AC (acromioclavicular) arthritis 06/04/2020  . Family hx of colon cancer 05/04/2019  . History of colonic polyps 05/04/2019  . Hypertension 02/22/2012  . High cholesterol 02/22/2012   Past Medical History:  Diagnosis Date  . Arthritis    shoulders and hands  . Diabetes mellitus without complication ([place])   . Enlarged prostate   . GERD (gastroesophageal reflux disease)   . Hyperlipidemia   . Hypertension   . Trigger thumb of left hand     History reviewed. No pertinent family history.  Past Surgical History:  Procedure Laterality Date  . CARPAL TUNNEL RELEASE    . COLONOSCOPY  03/24/2012   Procedure: COLONOSCOPY;  Surgeon: Rogene Houston, MD;  [facility];  Service: Endoscopy;  Laterality: N/A;  100  . COLONOSCOPY N/A 07/05/2019   Procedure: COLONOSCOPY;  Surgeon: Rogene Houston, MD;  [facility];  Service: Endoscopy;  Laterality: N/A;  1:00  . POLYPECTOMY  07/05/2019   Procedure: POLYPECTOMY;  Surgeon: Rogene Houston, MD;  [facility];  Service:  Endoscopy;;  . SHOULDER ACROMIOPLASTY Left 06/30/2017   Procedure: SHOULDER ACROMIOPLASTY;  Surgeon: Earlie Server, MD;  Location: [place];  Service: Orthopedics;  Laterality: Left;  . SHOULDER ARTHROSCOPY WITH OPEN ROTATOR CUFF REPAIR AND DISTAL CLAVICLE ACROMINECTOMY Left 06/30/2017   Procedure: LEFT SHOULDER ARTHROSCOPIC DEBRIDEMENT, WITH OPEN ROTATOR CUFF REPAIR, OPEN DISTAL CLAVICLE EXCISION, ACROMIOPLASTY;  Surgeon: Earlie Server, MD;  Location: [place];  Service: Orthopedics;  Laterality: Left;  . TRIGGER FINGER RELEASE Left 06/30/2017   Procedure: RELEASE TRIGGER FINGER/A-1 PULLEY LEFT THUMB;  Surgeon: Earlie Server, MD;  Location: [place];  Service: Orthopedics;  Laterality: Left;   Social History   Occupational History  . Not on file  Tobacco Use  . Smoking status: Former Smoker    Years: 0.00    Types: Cigarettes    Quit date: 06/30/2002    Years since quitting: 18.2  . Smokeless tobacco: Never Used  Vaping Use  . Vaping Use: Never used  Substance and Sexual Activity  . Alcohol use: No  . Drug use: No  . Sexual activity: Yes

## 2020-09-25 ENCOUNTER — Encounter: Payer: Self-pay | Admitting: Orthopaedic Surgery

## 2020-09-25 ENCOUNTER — Other Ambulatory Visit: Payer: Self-pay

## 2020-09-25 ENCOUNTER — Ambulatory Visit (INDEPENDENT_AMBULATORY_CARE_PROVIDER_SITE_OTHER): Payer: Managed Care, Other (non HMO) | Admitting: Orthopaedic Surgery

## 2020-09-25 VITALS — Ht 67.0 in | Wt 181.0 lb

## 2020-09-25 DIAGNOSIS — M75111 Incomplete rotator cuff tear or rupture of right shoulder, not specified as traumatic: Secondary | ICD-10-CM

## 2020-09-25 DIAGNOSIS — S46219A Strain of muscle, fascia and tendon of other parts of biceps, unspecified arm, initial encounter: Secondary | ICD-10-CM

## 2020-09-25 NOTE — Progress Notes (Signed)
Office Visit Note   Patient: [name]           Date of Birth: 1956/06/24           MRN: [id number] Visit Date: 09/25/2020              Requested by: Chesley Noon, MD [place],  [place] [address] PCP: Chesley Noon, MD   Assessment & Plan: Visit Diagnoses:  1. Biceps tendon tear   2. Nontraumatic incomplete tear of right rotator cuff     Plan: 1 month status post right shoulder surgery including an SCD, DCR and mini open rotator cuff tear repair and biceps tenodesis.  There was some mild retraction in a U-shaped tear of the supraspinatus.  Doing well.  Receiving physical therapy at [facility].  Still wearing a sling and not taking any pain medicine.  He is out of work until about the third week of December.  Will reevaluate in 2 weeks and consider back to work date  Follow-Up Instructions: Return in about 2 weeks (around 10/09/2020).   Orders:  No orders of the defined types were placed in this encounter.  No orders of the defined types were placed in this encounter.     Procedures: No procedures performed   Clinical Data: No additional findings.   Subjective: Chief Complaint  Patient presents with  . Right Shoulder - Follow-up    Right shoulder scope 08/22/2020  Patient presents today for follow up on his right shoulder. He had a right shoulder arthroscopy on 08/22/2020. He is now 5 weeks out from surgery. He has been going to physical therapy at [facility] in [place] twice weekly, but now is going to start going just once a week due to how much it cost. He is still in his sling. He states that he is doing great. No complaints and having very minimal pain. Not needing to take anything for pain control.  HPI  Review of Systems   Objective: Vital Signs: Ht 5\' 7"  (1.702 m)   Wt 181 lb (82.1 kg)   BMI 28.35 kg/m   Physical Exam  Ortho Exam right shoulder incisions healing without problem.  Good grip and good release.  I can abduct and flex  easily 90 degrees.  No impingement.  Specialty Comments:  No specialty comments available.  Imaging: No results found.   PMFS History: Patient Active Problem List   Diagnosis Date Noted  . Nontraumatic incomplete tear of right rotator cuff 08/13/2020  . Biceps tendon tear 08/13/2020  . Impingement syndrome of right shoulder 06/04/2020  . AC (acromioclavicular) arthritis 06/04/2020  . Family hx of colon cancer 05/04/2019  . History of colonic polyps 05/04/2019  . Hypertension 02/22/2012  . High cholesterol 02/22/2012   Past Medical History:  Diagnosis Date  . Arthritis    shoulders and hands  . Diabetes mellitus without complication ([place])   . Enlarged prostate   . GERD (gastroesophageal reflux disease)   . Hyperlipidemia   . Hypertension   . Trigger thumb of left hand     History reviewed. No pertinent family history.  Past Surgical History:  Procedure Laterality Date  . CARPAL TUNNEL RELEASE    . COLONOSCOPY  03/24/2012   Procedure: COLONOSCOPY;  Surgeon: Rogene Houston, MD;  [facility];  Service: Endoscopy;  Laterality: N/A;  100  . COLONOSCOPY N/A 07/05/2019   Procedure: COLONOSCOPY;  Surgeon: Rogene Houston, MD;  [facility];  Service: Endoscopy;  Laterality: N/A;  1:00  . POLYPECTOMY  07/05/2019   Procedure: POLYPECTOMY;  Surgeon: Rogene Houston, MD;  [facility];  Service: Endoscopy;;  . SHOULDER ACROMIOPLASTY Left 06/30/2017   Procedure: SHOULDER ACROMIOPLASTY;  Surgeon: Earlie Server, MD;  Location: [place];  Service: Orthopedics;  Laterality: Left;  . SHOULDER ARTHROSCOPY WITH OPEN ROTATOR CUFF REPAIR AND DISTAL CLAVICLE ACROMINECTOMY Left 06/30/2017   Procedure: LEFT SHOULDER ARTHROSCOPIC DEBRIDEMENT, WITH OPEN ROTATOR CUFF REPAIR, OPEN DISTAL CLAVICLE EXCISION, ACROMIOPLASTY;  Surgeon: Earlie Server, MD;  Location: [place];  Service: Orthopedics;  Laterality: Left;  . TRIGGER FINGER  RELEASE Left 06/30/2017   Procedure: RELEASE TRIGGER FINGER/A-1 PULLEY LEFT THUMB;  Surgeon: Earlie Server, MD;  Location: [place];  Service: Orthopedics;  Laterality: Left;   Social History   Occupational History  . Not on file  Tobacco Use  . Smoking status: Former Smoker    Years: 0.00    Types: Cigarettes    Quit date: 06/30/2002    Years since quitting: 18.2  . Smokeless tobacco: Never Used  Vaping Use  . Vaping Use: Never used  Substance and Sexual Activity  . Alcohol use: No  . Drug use: No  . Sexual activity: Yes

## 2020-10-09 ENCOUNTER — Other Ambulatory Visit: Payer: Self-pay

## 2020-10-09 ENCOUNTER — Ambulatory Visit (INDEPENDENT_AMBULATORY_CARE_PROVIDER_SITE_OTHER): Payer: Managed Care, Other (non HMO) | Admitting: Orthopaedic Surgery

## 2020-10-09 ENCOUNTER — Encounter: Payer: Self-pay | Admitting: Orthopaedic Surgery

## 2020-10-09 VITALS — Ht 67.0 in | Wt 181.0 lb

## 2020-10-09 DIAGNOSIS — M75111 Incomplete rotator cuff tear or rupture of right shoulder, not specified as traumatic: Secondary | ICD-10-CM

## 2020-10-09 NOTE — Progress Notes (Signed)
Office Visit Note   Patient: [name]           Date of Birth: [birth year]           MRN: [id number] Visit Date: 10/09/2020              Requested by: Chesley Noon, MD [place],  [place] [address] PCP: Chesley Noon, MD   Assessment & Plan: Visit Diagnoses:  1. Nontraumatic incomplete tear of right rotator cuff     Plan: 7 weeks status post right shoulder surgery including SCD, DCR, mini open rotator cuff tear repair and biceps tenodesis.  Doing well.  Continues with the sling and participating in physical therapy at [facility].  We will now discontinue the sling and continue with exercises at home and through physical therapy and reevaluate in 2 weeks.  We will continue to keep him out of work and reassess his status in 2 weeks.  He works in a [employer]/rest[employer] and does perform some heavy lifting which might be an issue for another month or so.  He certainly could use his left arm  Follow-Up Instructions: Return in about 2 weeks (around 10/23/2020).   Orders:  No orders of the defined types were placed in this encounter.  No orders of the defined types were placed in this encounter.     Procedures: No procedures performed   Clinical Data: No additional findings.   Subjective: Chief Complaint  Patient presents with  . Right Shoulder - Follow-up    Right shoulder arthroscopy 08/22/2020  Patient presents today for a follow up on his right shoulder. He had a right shoulder arthroscopy on 08/22/2020. He is now 7 weeks out from surgery. He has been going to physical therapy once weekly. He is not taking anything for pain. He is still wearing his sling. He does feel that he is improving.  HPI  Review of Systems   Objective: Vital Signs: Ht 5\' 7"  (1.702 m)   Wt 181 lb (82.1 kg)   BMI 28.35 kg/m   Physical Exam  Ortho Exam awake alert and oriented x3.  Comfortable sitting.  I could just about fully place his right arm overhead passively and  abduct 90 degrees.  He can hold 90 degrees comfortably.  Negative impingement.  Incisions healing without problem.  Good grip and release  Specialty Comments:  No specialty comments available.  Imaging: No results found.   PMFS History: Patient Active Problem List   Diagnosis Date Noted  . Nontraumatic incomplete tear of right rotator cuff 08/13/2020  . Biceps tendon tear 08/13/2020  . Impingement syndrome of right shoulder 06/04/2020  . AC (acromioclavicular) arthritis 06/04/2020  . Family hx of colon cancer 05/04/2019  . History of colonic polyps 05/04/2019  . Hypertension 02/22/2012  . High cholesterol 02/22/2012   Past Medical History:  Diagnosis Date  . Arthritis    shoulders and hands  . Diabetes mellitus without complication ([place])   . Enlarged prostate   . GERD (gastroesophageal reflux disease)   . Hyperlipidemia   . Hypertension   . Trigger thumb of left hand     History reviewed. No pertinent family history.  Past Surgical History:  Procedure Laterality Date  . CARPAL TUNNEL RELEASE    . COLONOSCOPY  03/24/2012   Procedure: COLONOSCOPY;  Surgeon: Rogene Houston, MD;  [facility];  Service: Endoscopy;  Laterality: N/A;  100  . COLONOSCOPY N/A 07/05/2019  Procedure: COLONOSCOPY;  Surgeon: Rogene Houston, MD;  [facility];  Service: Endoscopy;  Laterality: N/A;  1:00  . POLYPECTOMY  07/05/2019   Procedure: POLYPECTOMY;  Surgeon: Rogene Houston, MD;  [facility];  Service: Endoscopy;;  . SHOULDER ACROMIOPLASTY Left 06/30/2017   Procedure: SHOULDER ACROMIOPLASTY;  Surgeon: Earlie Server, MD;  Location: [place];  Service: Orthopedics;  Laterality: Left;  . SHOULDER ARTHROSCOPY WITH OPEN ROTATOR CUFF REPAIR AND DISTAL CLAVICLE ACROMINECTOMY Left 06/30/2017   Procedure: LEFT SHOULDER ARTHROSCOPIC DEBRIDEMENT, WITH OPEN ROTATOR CUFF REPAIR, OPEN DISTAL CLAVICLE EXCISION, ACROMIOPLASTY;  Surgeon: Earlie Server, MD;   Location: [place];  Service: Orthopedics;  Laterality: Left;  . TRIGGER FINGER RELEASE Left 06/30/2017   Procedure: RELEASE TRIGGER FINGER/A-1 PULLEY LEFT THUMB;  Surgeon: Earlie Server, MD;  Location: [place];  Service: Orthopedics;  Laterality: Left;   Social History   Occupational History  . Not on file  Tobacco Use  . Smoking status: Former Smoker    Years: 0.00    Types: Cigarettes    Quit date: 06/30/2002    Years since quitting: 18.2  . Smokeless tobacco: Never Used  Vaping Use  . Vaping Use: Never used  Substance and Sexual Activity  . Alcohol use: No  . Drug use: No  . Sexual activity: Yes

## 2020-10-23 ENCOUNTER — Ambulatory Visit (INDEPENDENT_AMBULATORY_CARE_PROVIDER_SITE_OTHER): Payer: Managed Care, Other (non HMO) | Admitting: Orthopaedic Surgery

## 2020-10-23 ENCOUNTER — Encounter: Payer: Self-pay | Admitting: Orthopaedic Surgery

## 2020-10-23 ENCOUNTER — Other Ambulatory Visit: Payer: Self-pay

## 2020-10-23 DIAGNOSIS — M75111 Incomplete rotator cuff tear or rupture of right shoulder, not specified as traumatic: Secondary | ICD-10-CM

## 2020-10-23 NOTE — Progress Notes (Signed)
Office Visit Note   Patient: [name]           Date of Birth: 1956-06-06           MRN: [id number] Visit Date: 10/23/2020              Requested by: Chesley Noon, MD [place],  [place] [address] PCP: Chesley Noon, MD   Assessment & Plan: Visit Diagnoses:  1. Nontraumatic incomplete tear of right rotator cuff     Plan: 2 months status post right shoulder SCD, DCR, mini open rotator cuff tear repair and biceps tenodesis. Doing very well. Completing his course of physical therapy and working on strength. Has a note to keep him out of work until at least 6 January. That will give him some time to increase his strength and ability to work. We will check him back in a month. The plan is to send it back to work on 6 January or wait until he is seen in the office depending upon how he progresses in therapy  Follow-Up Instructions: Return in about 1 month (around 11/23/2020).   Orders:  No orders of the defined types were placed in this encounter.  No orders of the defined types were placed in this encounter.     Procedures: No procedures performed   Clinical Data: No additional findings.   Subjective: Chief Complaint  Patient presents with  . Right Shoulder - Follow-up    Right shoulder arthroscopy 08/22/2020  Patient presents today for a two week follow up on his right shoulder. He had a right shoulder arthroscopy on 08/22/2020. He is now two months out from a right shoulder athroscopy. Patient states that he is doing great. He is going to physical therapy once weekly and doing a home exercise program. He said that therapy started doing strengthening exercises today with him. Not taking anything for pain.   HPI  Review of Systems   Objective: Vital Signs: There were no vitals taken for this visit.  Physical Exam  Ortho Exam right shoulder incisions have healed without a problem. Able to place his right arm fully overhead without any  problem., Negative impingement. Neurologically intact  Specialty Comments:  No specialty comments available.  Imaging: No results found.   PMFS History: Patient Active Problem List   Diagnosis Date Noted  . Nontraumatic incomplete tear of right rotator cuff 08/13/2020  . Biceps tendon tear 08/13/2020  . Impingement syndrome of right shoulder 06/04/2020  . AC (acromioclavicular) arthritis 06/04/2020  . Family hx of colon cancer 05/04/2019  . History of colonic polyps 05/04/2019  . Hypertension 02/22/2012  . High cholesterol 02/22/2012   Past Medical History:  Diagnosis Date  . Arthritis    shoulders and hands  . Diabetes mellitus without complication ([place])   . Enlarged prostate   . GERD (gastroesophageal reflux disease)   . Hyperlipidemia   . Hypertension   . Trigger thumb of left hand     History reviewed. No pertinent family history.  Past Surgical History:  Procedure Laterality Date  . CARPAL TUNNEL RELEASE    . COLONOSCOPY  03/24/2012   Procedure: COLONOSCOPY;  Surgeon: Rogene Houston, MD;  [facility];  Service: Endoscopy;  Laterality: N/A;  100  . COLONOSCOPY N/A 07/05/2019   Procedure: COLONOSCOPY;  Surgeon: Rogene Houston, MD;  [facility];  Service: Endoscopy;  Laterality: N/A;  1:00  . POLYPECTOMY  07/05/2019  Procedure: POLYPECTOMY;  Surgeon: Rogene Houston, MD;  [facility];  Service: Endoscopy;;  . SHOULDER ACROMIOPLASTY Left 06/30/2017   Procedure: SHOULDER ACROMIOPLASTY;  Surgeon: Earlie Server, MD;  Location: [place];  Service: Orthopedics;  Laterality: Left;  . SHOULDER ARTHROSCOPY WITH OPEN ROTATOR CUFF REPAIR AND DISTAL CLAVICLE ACROMINECTOMY Left 06/30/2017   Procedure: LEFT SHOULDER ARTHROSCOPIC DEBRIDEMENT, WITH OPEN ROTATOR CUFF REPAIR, OPEN DISTAL CLAVICLE EXCISION, ACROMIOPLASTY;  Surgeon: Earlie Server, MD;  Location: [place];  Service: Orthopedics;  Laterality: Left;  .  TRIGGER FINGER RELEASE Left 06/30/2017   Procedure: RELEASE TRIGGER FINGER/A-1 PULLEY LEFT THUMB;  Surgeon: Earlie Server, MD;  Location: [place];  Service: Orthopedics;  Laterality: Left;   Social History   Occupational History  . Not on file  Tobacco Use  . Smoking status: Former Smoker    Years: 0.00    Types: Cigarettes    Quit date: 06/30/2002    Years since quitting: 18.3  . Smokeless tobacco: Never Used  Vaping Use  . Vaping Use: Never used  Substance and Sexual Activity  . Alcohol use: No  . Drug use: No  . Sexual activity: Yes

## 2020-11-14 ENCOUNTER — Other Ambulatory Visit: Payer: Self-pay

## 2020-11-14 ENCOUNTER — Ambulatory Visit (INDEPENDENT_AMBULATORY_CARE_PROVIDER_SITE_OTHER): Payer: Managed Care, Other (non HMO) | Admitting: Orthopaedic Surgery

## 2020-11-14 ENCOUNTER — Encounter: Payer: Self-pay | Admitting: Orthopaedic Surgery

## 2020-11-14 DIAGNOSIS — M7541 Impingement syndrome of right shoulder: Secondary | ICD-10-CM

## 2020-11-14 DIAGNOSIS — M75111 Incomplete rotator cuff tear or rupture of right shoulder, not specified as traumatic: Secondary | ICD-10-CM

## 2020-11-14 NOTE — Progress Notes (Signed)
Office Visit Note   Patient: [name]           Date of Birth: [birth year]           MRN: [id number] Visit Date: 11/14/2020              Requested by: Eartha Inch, MD [address] [place],  [place] [address] PCP: Eartha Inch, MD   Assessment & Plan: Visit Diagnoses:  1. Nontraumatic incomplete tear of right rotator cuff   2. Impingement syndrome of right shoulder     Plan: Nearly 3 months status post rotator cuff tear repair of his right shoulder with an SCD, DCR and biceps tenodesis.  He has completed a course of physical therapy and doing very well.  He is very happy with the results.  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  He will be very careful with his activities and continue with his home exercise program.  I will plan to see him back as needed  Follow-Up Instructions: Return if symptoms worsen or fail to improve.   Orders:  No orders of the defined types were placed in this encounter.  No orders of the defined types were placed in this encounter.     Procedures: No procedures performed   Clinical Data: No additional findings.   Subjective: Chief Complaint  Patient presents with  . Right Shoulder - Routine Post Op, Follow-up  Nearly 3 months status post right shoulder surgery including arthroscopic SCD, DCR and a mini open rotator cuff tear repair and biceps tenodesis.  Relates that he is doing very well and does not have any issues with activities around the house or sleeping.  He has finished a course of physical therapy and has a home exercise program.  He would like to return to work being careful with his activities for at least a month on Sunday, January 9  HPI  Review of Systems   Objective: Vital Signs: There were no vitals taken for this visit.  Physical Exam  Ortho Exam incision is healed without problem full quick overhead motion with negative impingement and empty can testing.  No  grating or crepitation.  Good grip and good release.  Good strength  Specialty Comments:  No specialty comments available.  Imaging: No results found.   PMFS History: Patient Active Problem List   Diagnosis Date Noted  . Nontraumatic incomplete tear of right rotator cuff 08/13/2020  . Biceps tendon tear 08/13/2020  . Impingement syndrome of right shoulder 06/04/2020  . AC (acromioclavicular) arthritis 06/04/2020  . Family hx of colon cancer 05/04/2019  . History of colonic polyps 05/04/2019  . Hypertension 02/22/2012  . High cholesterol 02/22/2012   Past Medical History:  Diagnosis Date  . Arthritis    shoulders and hands  . Diabetes mellitus without complication ([facility])   . Enlarged prostate   . GERD (gastroesophageal reflux disease)   . Hyperlipidemia   . Hypertension   . Trigger thumb of left hand     History reviewed. No pertinent family history.  Past Surgical History:  Procedure Laterality Date  . CARPAL TUNNEL RELEASE    . COLONOSCOPY  03/24/2012   Procedure: COLONOSCOPY;  Surgeon: Malissa Hippo, MD;  [facility];  Service: Endoscopy;  Laterality: N/A;  100  . COLONOSCOPY N/A 07/05/2019   Procedure: COLONOSCOPY;  Surgeon: Malissa Hippo, MD;  [facility];  Service:  Endoscopy;  Laterality: N/A;  1:00  . POLYPECTOMY  07/05/2019   Procedure: POLYPECTOMY;  Surgeon: Rogene Houston, MD;  [facility];  Service: Endoscopy;;  . SHOULDER ACROMIOPLASTY Left 06/30/2017   Procedure: SHOULDER ACROMIOPLASTY;  Surgeon: Earlie Server, MD;  Location: [place];  Service: Orthopedics;  Laterality: Left;  . SHOULDER ARTHROSCOPY WITH OPEN ROTATOR CUFF REPAIR AND DISTAL CLAVICLE ACROMINECTOMY Left 06/30/2017   Procedure: LEFT SHOULDER ARTHROSCOPIC DEBRIDEMENT, WITH OPEN ROTATOR CUFF REPAIR, OPEN DISTAL CLAVICLE EXCISION, ACROMIOPLASTY;  Surgeon: Earlie Server, MD;  Location: [place];  Service: Orthopedics;   Laterality: Left;  . TRIGGER FINGER RELEASE Left 06/30/2017   Procedure: RELEASE TRIGGER FINGER/A-1 PULLEY LEFT THUMB;  Surgeon: Earlie Server, MD;  Location: [place];  Service: Orthopedics;  Laterality: Left;   Social History   Occupational History  . Not on file  Tobacco Use  . Smoking status: Former Smoker    Years: 0.00    Types: Cigarettes    Quit date: 06/30/2002    Years since quitting: 18.3  . Smokeless tobacco: Never Used  Vaping Use  . Vaping Use: Never used  Substance and Sexual Activity  . Alcohol use: No  . Drug use: No  . Sexual activity: Yes     Garald Balding, MD   Note - This record has been created using [employer].  Chart creation errors have been sought, but may not always  have been located. Such creation errors do not reflect on  the standard of medical care.

## 2021-12-28 ENCOUNTER — Encounter (HOSPITAL_COMMUNITY): Payer: Self-pay | Admitting: Pharmacy Technician

## 2021-12-28 ENCOUNTER — Emergency Department (HOSPITAL_COMMUNITY)
Admission: EM | Admit: 2021-12-28 | Discharge: 2021-12-28 | Disposition: A | Discharge status: Home / Self Care | Payer: Medicare Other | Attending: Emergency Medicine | Admitting: Emergency Medicine

## 2021-12-28 ENCOUNTER — Other Ambulatory Visit: Payer: Self-pay

## 2021-12-28 DIAGNOSIS — Z7984 Long term (current) use of oral hypoglycemic drugs: Secondary | ICD-10-CM | POA: Insufficient documentation

## 2021-12-28 DIAGNOSIS — E876 Hypokalemia: Secondary | ICD-10-CM | POA: Insufficient documentation

## 2021-12-28 DIAGNOSIS — Z79899 Other long term (current) drug therapy: Secondary | ICD-10-CM | POA: Insufficient documentation

## 2021-12-28 DIAGNOSIS — R197 Diarrhea, unspecified: Secondary | ICD-10-CM | POA: Diagnosis present

## 2021-12-28 LAB — CBC WITH DIFFERENTIAL/PLATELET
Abs Immature Granulocytes: 0.02 10*3/uL (ref 0.00–0.07)
Basophils Absolute: 0 10*3/uL (ref 0.0–0.1)
Basophils Relative: 0 %
Eosinophils Absolute: 0.4 10*3/uL (ref 0.0–0.5)
Eosinophils Relative: 5 %
HCT: 40.1 % (ref 39.0–52.0)
Hemoglobin: 13.6 g/dL (ref 13.0–17.0)
Immature Granulocytes: 0 %
Lymphocytes Relative: 19 %
Lymphs Abs: 1.5 10*3/uL (ref 0.7–4.0)
MCH: 27.8 pg (ref 26.0–34.0)
MCHC: 33.9 g/dL (ref 30.0–36.0)
MCV: 82 fL (ref 80.0–100.0)
Monocytes Absolute: 0.7 10*3/uL (ref 0.1–1.0)
Monocytes Relative: 8 %
Neutro Abs: 5.4 10*3/uL (ref 1.7–7.7)
Neutrophils Relative %: 68 %
Platelets: 308 10*3/uL (ref 150–400)
RBC: 4.89 MIL/uL (ref 4.22–5.81)
RDW: 13.8 % (ref 11.5–15.5)
WBC: 8.1 10*3/uL (ref 4.0–10.5)
nRBC: 0 % (ref 0.0–0.2)

## 2021-12-28 LAB — COMPREHENSIVE METABOLIC PANEL
ALT: 52 U/L — ABNORMAL HIGH (ref 0–44)
AST: 41 U/L (ref 15–41)
Albumin: 4.1 g/dL (ref 3.5–5.0)
Alkaline Phosphatase: 41 U/L (ref 38–126)
Anion gap: 7 (ref 5–15)
BUN: 11 mg/dL (ref 8–23)
CO2: 28 mmol/L (ref 22–32)
Calcium: 8.7 mg/dL — ABNORMAL LOW (ref 8.9–10.3)
Chloride: 101 mmol/L (ref 98–111)
Creatinine, Ser: 0.77 mg/dL (ref 0.61–1.24)
GFR, Estimated: >60 mL/min (ref >60)
Glucose, Bld: 138 mg/dL — ABNORMAL HIGH (ref 70–99)
Potassium: 3 mmol/L — ABNORMAL LOW (ref 3.5–5.1)
Sodium: 136 mmol/L (ref 135–145)
Total Bilirubin: 0.4 mg/dL (ref 0.3–1.2)
Total Protein: 7.5 g/dL (ref 6.5–8.1)

## 2021-12-28 MED ORDER — LOPERAMIDE HCL 2 MG PO CAPS: 2.0000 mg | ORAL_CAPSULE | Freq: Four times a day (QID) | ORAL | 0 refills | Status: AC | PRN

## 2021-12-28 MED ORDER — POTASSIUM CHLORIDE CRYS ER 20 MEQ PO TBCR
40.0000 meq | EXTENDED_RELEASE_TABLET | Freq: Once | ORAL | Status: AC
  Administered 2021-12-28: 40 meq via ORAL
  Filled 2021-12-28: qty 2

## 2021-12-28 MED ORDER — SODIUM CHLORIDE 0.9 % IV BOLUS
1000.0000 mL | Freq: Once | INTRAVENOUS | Status: AC
  Administered 2021-12-28: 1000 mL via INTRAVENOUS

## 2021-12-28 MED ORDER — POTASSIUM CHLORIDE CRYS ER 20 MEQ PO TBCR: 20.0000 meq | EXTENDED_RELEASE_TABLET | Freq: Two times a day (BID) | ORAL | 0 refills | Status: AC

## 2021-12-28 MED ORDER — LOPERAMIDE HCL 2 MG PO CAPS
4.0000 mg | ORAL_CAPSULE | Freq: Once | ORAL | Status: AC
  Administered 2021-12-28: 4 mg via ORAL
  Filled 2021-12-28: qty 2

## 2021-12-28 NOTE — ED Provider Notes (Signed)
[facility] EMERGENCY DEPARTMENT Provider Note   CSN: [id number] Arrival date & time: 12/28/21  1607     History  Chief Complaint  Patient presents with   Diarrhea   Abdominal Pain    [name] is a 66 y.o. male.  Patient presents with complaint of diarrhea and vomiting.  Symptoms started a week ago.  The vomiting has resolved but has been having persistent diarrhea.  He saw his primary care doctor few days ago had labs done and his potassium was low at 3.1.  He has had continued diarrhea and intermittent abdominal cramping and presents to the ER.  Currently denies abdominal pain.      Home Medications Prior to Admission medications   Medication Sig Start Date End Date Taking? Authorizing Provider  loperamide (IMODIUM) 2 MG capsule Take 1 capsule (2 mg total) by mouth 4 (four) times daily as needed for diarrhea or loose stools. 12/28/21  Yes Adiyah Lame, Greggory Brandy, MD  potassium chloride SA (KLOR-CON M) 20 MEQ tablet Take 1 tablet (20 mEq total) by mouth 2 (two) times daily for 2 days. 12/28/21 12/30/21 Yes Luna Fuse, MD  amLODipine (NORVASC) 10 MG tablet Take 10 mg by mouth at bedtime.     [provider]  Bioflavonoid Products (SUPER C-500 PO) Take 1 tablet by mouth daily.    [provider]  Blood Glucose Monitoring Suppl (ONE TOUCH ULTRA 2) w/Device KIT Use to check blood sugar daily as directed by physician. 12/02/19   [provider]  celecoxib (CELEBREX) 200 MG capsule Take 200 mg by mouth daily as needed (inflammation/joint tightness.).     [provider]  finasteride (PROSCAR) 5 MG tablet Take 5 mg by mouth daily.    [provider]  hydrochlorothiazide (MICROZIDE) 12.5 MG capsule Take 12.5 mg by mouth daily.    [provider]  metFORMIN (GLUCOPHAGE-XR) 500 MG 24 hr tablet Take 500 mg by mouth 2 (two) times daily. 04/27/19   [provider]  oxyCODONE (ROXICODONE) 5 MG immediate release tablet Take 1 tablet (5  mg total) by mouth every 4 (four) hours as needed for moderate pain or severe pain. 08/22/20   Cherylann Ratel, PA-C  oxyCODONE-acetaminophen (PERCOCET/ROXICET) 5-325 MG tablet Take 1 tablet by mouth every 6 (six) hours as needed for severe pain. 08/29/20   Garald Balding, MD  Polyethyl Glycol-Propyl Glycol (LUBRICANT EYE DROPS) 0.4-0.3 % SOLN Place 1 drop into both eyes 3 (three) times daily as needed (dry/irritated eyes.).    [provider]  pravastatin (PRAVACHOL) 40 MG tablet Take 40 mg by mouth at bedtime.     [provider]      Allergies    Cozaar and Lisinopril    Review of Systems   Review of Systems  Constitutional:  Negative for fever.  HENT:  Negative for ear pain and sore throat.   Eyes:  Negative for pain.  Respiratory:  Negative for cough.   Cardiovascular:  Negative for chest pain.  Gastrointestinal:  Positive for abdominal pain, diarrhea and vomiting.  Genitourinary:  Negative for flank pain.  Musculoskeletal:  Negative for back pain.  Skin:  Negative for color change and rash.  Neurological:  Negative for syncope.  All other systems reviewed and are negative.  Physical Exam Updated Vital Signs BP 130/74    Pulse 62    Temp 98.2 F (36.8 C) (Oral)    Resp 16    SpO2 97%  Physical Exam  Constitutional:      Appearance: He is well-developed.  HENT:     Head: Normocephalic.     Nose: Nose normal.  Eyes:     Extraocular Movements: Extraocular movements intact.  Cardiovascular:     Rate and Rhythm: Normal rate.  Pulmonary:     Effort: Pulmonary effort is normal.  Abdominal:     Tenderness: There is no abdominal tenderness. There is no guarding or rebound.  Skin:    Coloration: Skin is not jaundiced.  Neurological:     Mental Status: He is alert. Mental status is at baseline.    ED Results / Procedures / Treatments   Labs (all labs ordered are listed, but only abnormal results are displayed) Labs Reviewed  COMPREHENSIVE METABOLIC  PANEL - Abnormal; Notable for the following components:      Result Value   Potassium 3.0 (*)    Glucose, Bld 138 (*)    Calcium 8.7 (*)    ALT 52 (*)    All other components within normal limits  CBC WITH DIFFERENTIAL/PLATELET    EKG None  Radiology No results found.  Procedures Procedures    Medications Ordered in ED Medications  loperamide (IMODIUM) capsule 4 mg (4 mg Oral Given 12/28/21 0948)  sodium chloride 0.9 % bolus 1,000 mL (1,000 mLs Intravenous New Bag/Given 12/28/21 0948)  potassium chloride SA (KLOR-CON M) CR tablet 40 mEq (40 mEq Oral Given 12/28/21 1032)    ED Course/ Medical Decision Making/ A&P                           Medical Decision Making Amount and/or Complexity of Data Reviewed Labs: ordered.  Risk Prescription drug management.   History obtained from patient's wife at bedside as well.  Review of records show office visit December 26, 2021 for abdominal pain.  Labs today show a potassium 3.0.  Otherwise unremarkable CBC.  Patient given Imodium and potassium replacement and appears bolus of fluids.  Continues to have no abdominal pain.  Abdominal exam is benign with no guarding or tenderness or rebound.  Will be discharged home in stable condition.  Advised him to follow-up with his doctor within 3 days.  Advised immediate return if he is unable to keep down any fluids has fevers chest pain or any additional concerns return immediately back to the ER.        Final Clinical Impression(s) / ED Diagnoses Final diagnoses:  Diarrhea, unspecified type  Hypokalemia    Rx / DC Orders ED Discharge Orders          Ordered    potassium chloride SA (KLOR-CON M) 20 MEQ tablet  2 times daily        12/28/21 1308    loperamide (IMODIUM) 2 MG capsule  4 times daily PRN        12/28/21 1308              Luna Fuse, MD 12/28/21 1308

## 2021-12-28 NOTE — ED Notes (Signed)
Patient Alert and oriented to baseline. Stable and ambulatory to baseline. Patient verbalized understanding of the discharge instructions.  Patient belongings were taken by the patient.   

## 2021-12-28 NOTE — ED Triage Notes (Signed)
Pt here with reports of diarrhea X1 week. Pt states he has intermittent central abdominal pain. Seen for the same 2 days ago and states his potassium was low.

## 2021-12-28 NOTE — Discharge Instructions (Signed)
Call your primary care doctor or specialist as discussed in the next 2-3 days.   Return immediately back to the ER if:  Your symptoms worsen within the next 12-24 hours. You develop new symptoms such as new fevers, persistent vomiting, new pain, shortness of breath, or new weakness or numbness, or if you have any other concerns.  

## 2022-06-18 IMAGING — MR MR SHOULDER*R* W/O CM
4 of 5 series · 15 of 40 positions shown · non-contrast
Comparison: None.

CLINICAL DATA: Right shoulder pain and limited range of motion for
2 years.

EXAM:
MRI OF THE RIGHT SHOULDER WITHOUT CONTRAST
TECHNIQUE: Multiplanar, multisequence MR imaging of the shoulder was performed.
No intravenous contrast was administered.

[Series 5: PD fat-sat · axial · right · 4.0mm · 0.22mm/px · z∈[-41,+36]mm · 6 of 20 slices shown (1 of 2)]
[im 1/20]
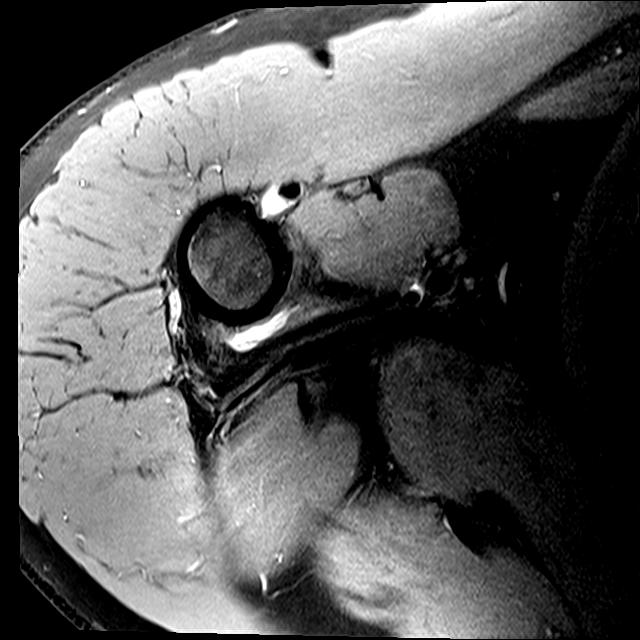
[im 3/20]
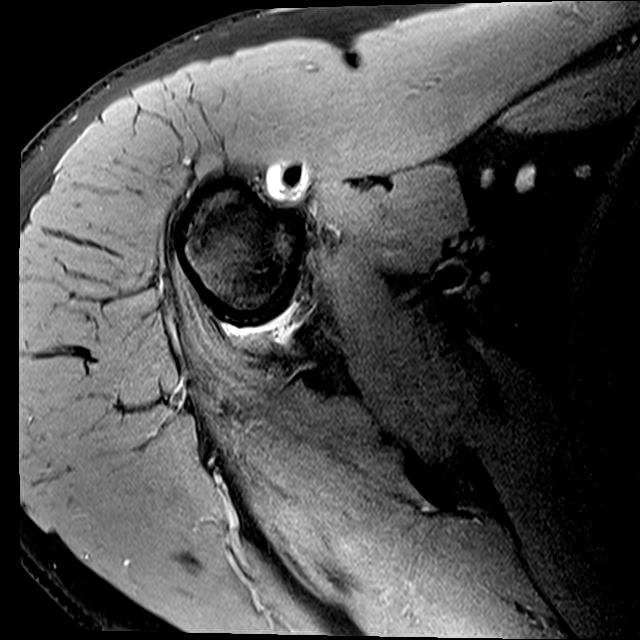
[im 6/20]
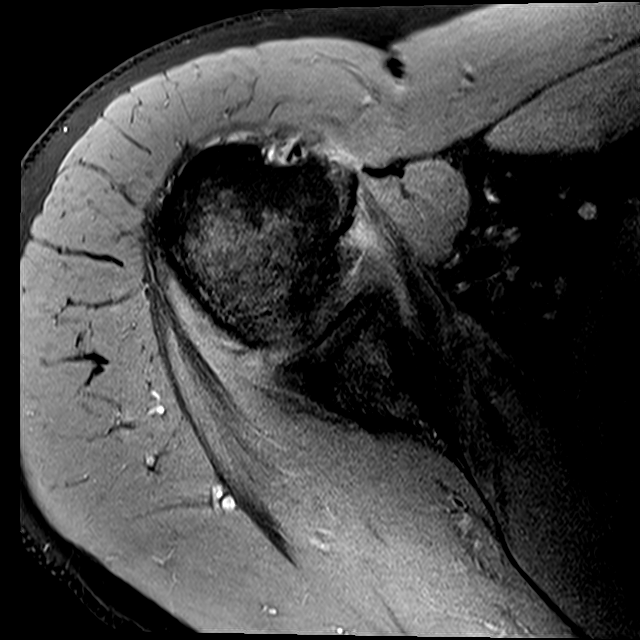
[im 9/20]
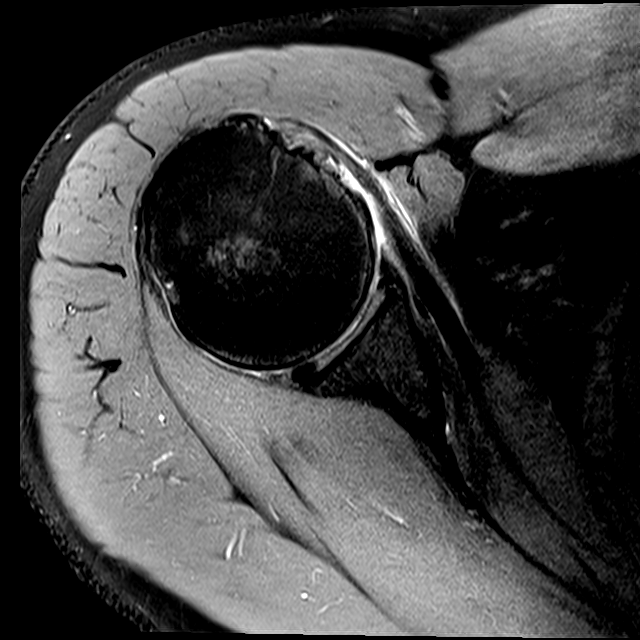
[im 11/20]
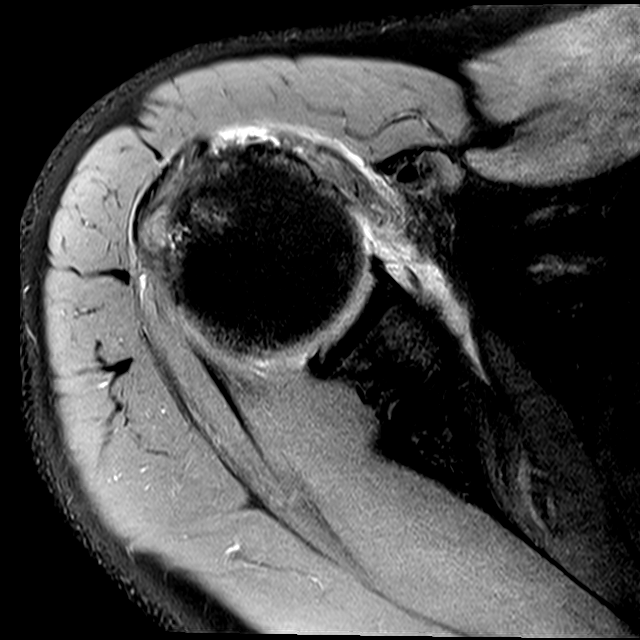
[im 17/20]
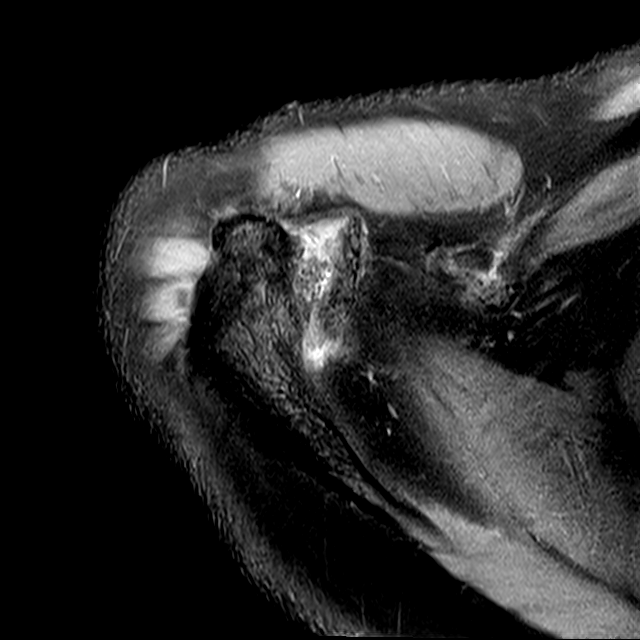

[Series 6: T2 fat-sat · oblique · right · 4.0mm · 0.22mm/px · 3 of 19 slices shown (1 of 2)]
[im 3/19]
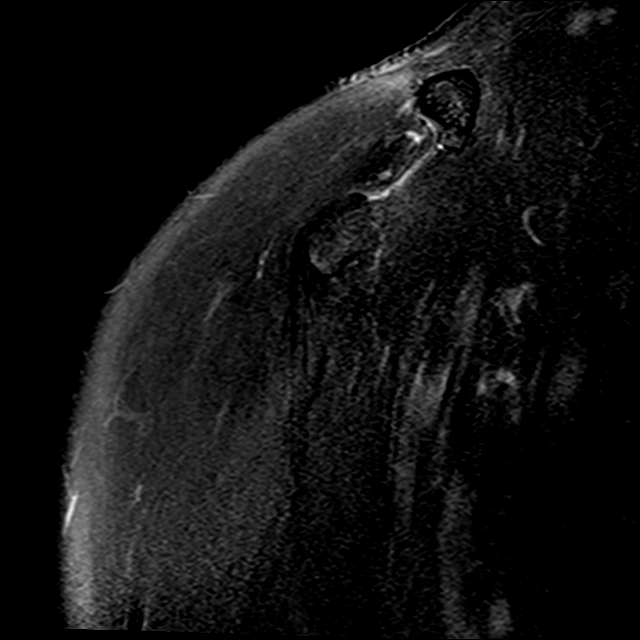
[im 11/19]
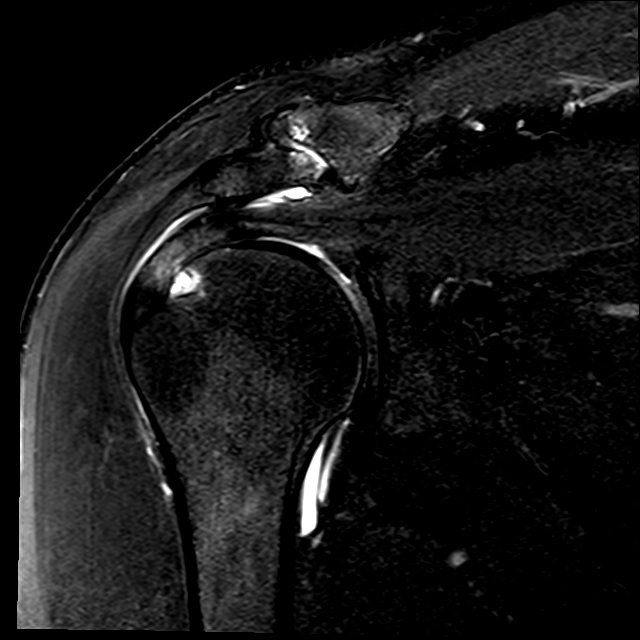
[im 16/19]
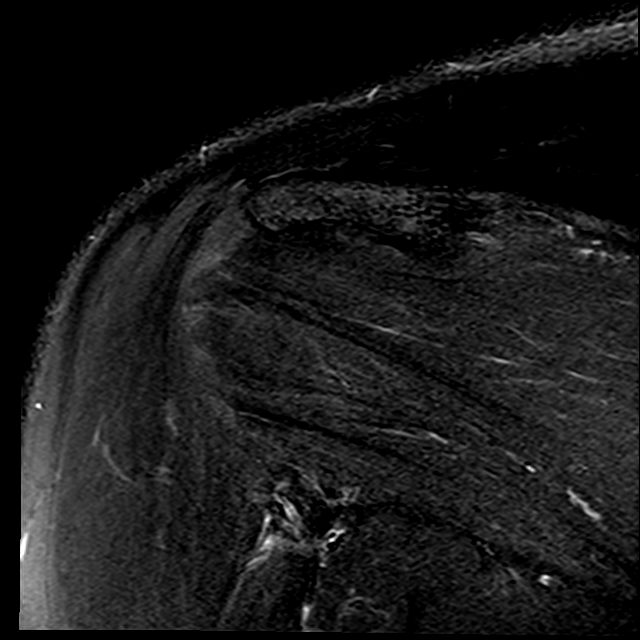

[Series 7: PD fat-sat · oblique · right · 4.0mm · 0.22mm/px · 3 of 19 slices shown (2 of 2)]
[im 3/19]
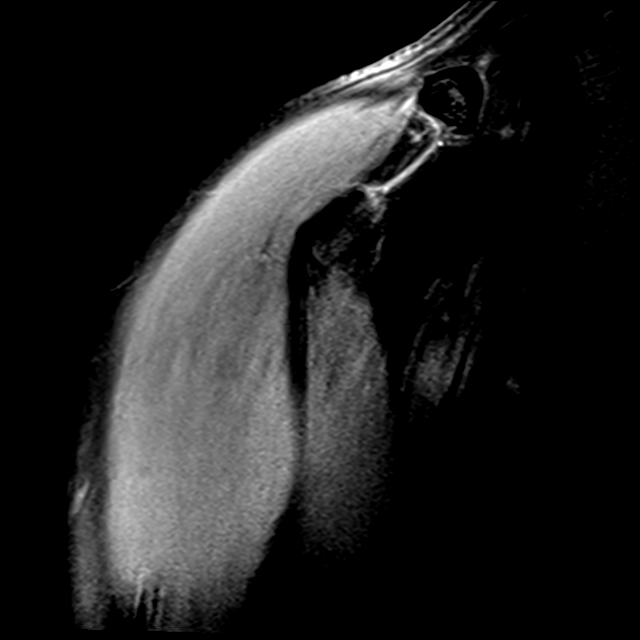
[im 11/19]
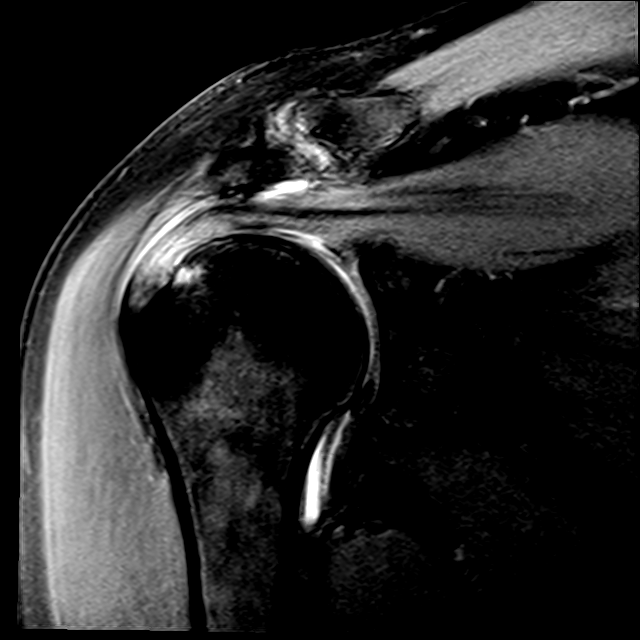
[im 16/19]
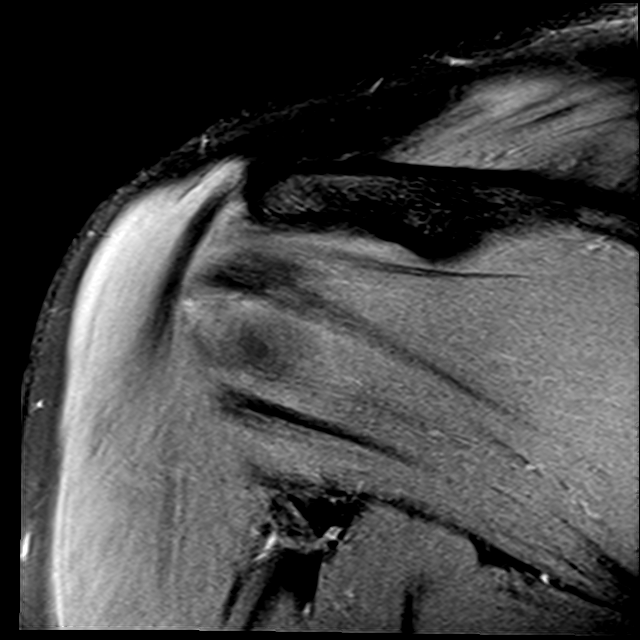

[Series 8: T2 fat-sat · oblique · right · 4.0mm · 0.44mm/px · 3 of 21 slices shown (2 of 2)]
[im 3/21]
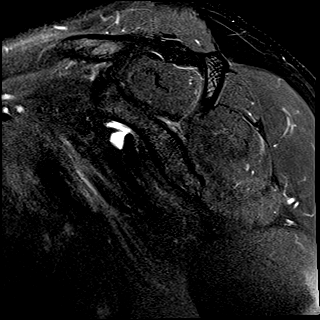
[im 12/21]
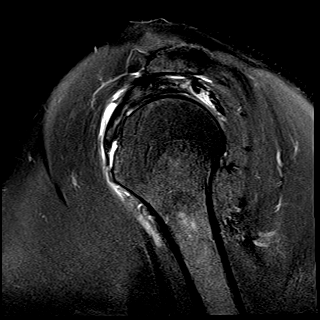
[im 18/21]
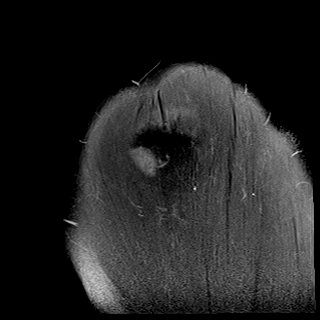

[15 of 40 positions shown; findings below may reference images not displayed]

FINDINGS: Rotator cuff: The patient has rotator cuff tendinopathy with
thickening and intermediate increased T2 signal seen. A partial
width, full-thickness tear of the anterior and far lateral
supraspinatus measures 0.9 cm from front to back. There is little to
no retraction. The subscapularis is attenuated but no focal tear is
identified.

Muscles: Mild appearing subscapularis atrophy.  Otherwise negative.

Biceps long head:  Completely torn from the superior labrum.

Acromioclavicular Joint: Bulky osteoarthritis. Type 2 acromion.
There is subacromial spurring. A small volume of fluid is present in
the subacromial/subdeltoid bursa.

Glenohumeral Joint: Negative.

Labrum:  Intact.

Bones:  No fracture or focal lesion.

Other: None.
IMPRESSION: Complete tear of the long head of biceps from the superior labrum.

Rotator cuff tendinopathy with a 0.9 cm from front to back
full-thickness tear of the anterior and far lateral supraspinatus.
There is minimal retraction and no atrophy. Markedly attenuated
subscapularis tendon without focal tear also noted. Mild
subscapularis atrophy is seen.

Bulky acromioclavicular osteoarthritis. Subacromial spurring also
noted.

Small volume of subacromial/subdeltoid fluid compatible with
bursitis.

## 2024-05-26 ENCOUNTER — Encounter (INDEPENDENT_AMBULATORY_CARE_PROVIDER_SITE_OTHER): Payer: Self-pay | Admitting: *Deleted
